# Patient Record
Sex: Female | Born: 2000 | Race: Black or African American | Hispanic: No | Marital: Single | Smoking: Former smoker
Health system: Southern US, Community
[De-identification: ages and names within clinical notes are randomized; demographics above are authoritative.]

## PROBLEM LIST (undated history)

## (undated) ENCOUNTER — Inpatient Hospital Stay (HOSPITAL_COMMUNITY): Payer: Self-pay

## (undated) ENCOUNTER — Emergency Department (HOSPITAL_COMMUNITY)

## (undated) DIAGNOSIS — Z789 Other specified health status: Secondary | ICD-10-CM

## (undated) HISTORY — PX: NO PAST SURGERIES: SHX2092

## (undated) HISTORY — DX: Other specified health status: Z78.9

---
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
## 2020-08-23 ENCOUNTER — Ambulatory Visit
Admission: EM | Admit: 2020-08-23 | Discharge: 2020-08-23 | Disposition: A | Discharge status: Home / Self Care | Payer: Medicaid Other

## 2020-08-23 ENCOUNTER — Other Ambulatory Visit: Payer: Self-pay

## 2020-08-23 ENCOUNTER — Encounter: Payer: Self-pay | Admitting: Family Medicine

## 2020-08-23 DIAGNOSIS — R519 Headache, unspecified: Secondary | ICD-10-CM

## 2020-08-23 NOTE — ED Triage Notes (Signed)
Pt missed work for headache that has resolved, just need work note to excuse

## 2020-08-23 NOTE — ED Provider Notes (Signed)
[facility]    CSN: [id number] Arrival date & time: 08/23/20  1148      History   Chief Complaint Chief Complaint  Patient presents with   Letter for School/Work    HPI Jane Duke is a 20 y.o. female.   Reports headache yesterday and today. States that the pain is at the temples and at the front of her head. States that the pain woke her up last night. States that she took Excedrin at midnight last night with relief. Denies previous symptoms. Denies dizziness, fatigue, SOB, abdominal pain, nausea, vomiting, diarrhea, rash, fever, other symptoms.  ROS per HPI  The history is provided by the patient.   History reviewed. No pertinent past medical history.  There are no problems to display for this patient.   History reviewed. No pertinent surgical history.  OB History   No obstetric history on file.      Home Medications    Prior to Admission medications   Not on File    Family History History reviewed. No pertinent family history.  Social History     Allergies   Patient has no known allergies.   Review of Systems Review of Systems   Physical Exam Triage Vital Signs ED Triage Vitals [08/23/20 1259]  Enc Vitals Group     BP 107/70     Pulse Rate 72     Resp 20     Temp 98.4 F (36.9 C)     Temp src      SpO2 97 %     Weight      Height      Head Circumference      Peak Flow      Pain Score      Pain Loc      Pain Edu?      Excl. in GC?    No data found.  Updated Vital Signs BP 107/70   Pulse 72   Temp 98.4 F (36.9 C)   Resp 20   SpO2 97%       Physical Exam Vitals and nursing note reviewed.  Constitutional:      General: She is not in acute distress.    Appearance: Normal appearance. She is well-developed.  HENT:     Head: Normocephalic and atraumatic.     Nose: Nose normal.     Mouth/Throat:     Mouth: Mucous membranes are moist.     Pharynx: Oropharynx is clear.  Eyes:     Extraocular Movements:  Extraocular movements intact.     Conjunctiva/sclera: Conjunctivae normal.     Pupils: Pupils are equal, round, and reactive to light.  Cardiovascular:     Rate and Rhythm: Normal rate and regular rhythm.  Pulmonary:     Effort: Pulmonary effort is normal. No respiratory distress.  Musculoskeletal:        General: Normal range of motion.     Cervical back: Normal range of motion and neck supple.  Skin:    General: Skin is warm and dry.     Capillary Refill: Capillary refill takes less than 2 seconds.  Neurological:     General: No focal deficit present.     Mental Status: She is alert and oriented to person, place, and time.  Psychiatric:        Mood and Affect: Mood normal.        Behavior: Behavior normal.        Thought Content: Thought content normal.  UC Treatments / Results  Labs (all labs ordered are listed, but only abnormal results are displayed) Labs Reviewed - No data to display  EKG   Radiology No results found.  Procedures Procedures (including critical care time)  Medications Ordered in UC Medications - No data to display  Initial Impression / Assessment and Plan / UC Course  I have reviewed the triage vital signs and the nursing notes.  Pertinent labs & imaging results that were available during my care of the patient were reviewed by me and considered in my medical decision making (see chart for details).    Headache  May continue Excedrin migraine Drink plenty of fluids as well Work note provided Follow up with this office or with primary care if symptoms are persisting.  Follow up in the ER for high fever, trouble swallowing, trouble breathing, other concerning symptoms.   Final Clinical Impressions(s) / UC Diagnoses   Final diagnoses:  Nonintractable headache, unspecified chronicity pattern, unspecified headache type     Discharge Instructions      May take excedrin migraine for headaches as needed  Follow up with this office  or with primary care if symptoms are persisting.  Follow up in the ER for high fever, trouble swallowing, trouble breathing, other concerning symptoms.      ED Prescriptions   None    PDMP not reviewed this encounter.   Moshe Cipro, NP 08/23/20 1350

## 2020-08-23 NOTE — Discharge Instructions (Signed)
May take excedrin migraine for headaches as needed  Follow up with this office or with primary care if symptoms are persisting.  Follow up in the ER for high fever, trouble swallowing, trouble breathing, other concerning symptoms.

## 2021-03-04 NOTE — L&D Delivery Note (Signed)
OB/GYN Faculty Practice Delivery Note  Jane Duke is a 21 y.o. G1P0 s/p SVD at [redacted]w[redacted]d. She was admitted for PROM.   ROM: 13h 66m with clear fluid GBS Status:  Negative/-- (09/25 1625)  Labor Progress: Initial SVE: 1.5/60/-2. She then progressed to complete.   Delivery Date/Time: 12/20/21 2034 Delivery: Called to room and patient was complete and pushing. Head delivered OA. No nuchal cord present. Shoulder and body delivered in usual fashion. Infant with spontaneous cry, placed on mother's abdomen, dried and stimulated. Cord clamped x 2 after 1-minute delay, and cut by FOB. Cord blood drawn. Placenta delivered spontaneously with gentle cord traction. Fundus firm with massage and Pitocin. Labia, perineum, vagina, and cervix inspected with 1st degree laceration noted, repaired in usual fashion.  Baby Weight: pending  Placenta: 3 vessel, intact. Sent to L&D Complications: None Lacerations: 1st degree EBL: 86 mL Analgesia: None   Infant:  APGAR (1 MIN): 6   APGAR (5 MINS): Barton, DO OB Family Medicine Fellow, [facility] for [employer], [employer] [facility] 12/20/2021, 9:11 PM

## 2021-04-17 ENCOUNTER — Ambulatory Visit
Admission: EM | Admit: 2021-04-17 | Discharge: 2021-04-17 | Disposition: A | Discharge status: Home / Self Care | Payer: Medicaid Other | Attending: Student | Admitting: Student

## 2021-04-17 ENCOUNTER — Other Ambulatory Visit: Payer: Self-pay

## 2021-04-17 DIAGNOSIS — Z3201 Encounter for pregnancy test, result positive: Secondary | ICD-10-CM

## 2021-04-17 LAB — POCT URINE PREGNANCY: Preg Test, Ur: POSITIVE — AB

## 2021-04-17 NOTE — ED Provider Notes (Signed)
[facility]    CSN: [id number] Arrival date & time: 04/17/21  0951      History   Chief Complaint Chief Complaint  Patient presents with   Possible Pregnancy    HPI Jane Duke is a 21 y.o. female presenting to verify pregnancy.  Medical history noncontributory, this is her first pregnancy.  Last menstrual period was 03/24/2021.  She is feeling well, denies nausea, vomiting, abdominal pain, vaginal spotting, vaginal discharge, urinary symptoms.  HPI  History reviewed. No pertinent past medical history.  There are no problems to display for this patient.   History reviewed. No pertinent surgical history.  OB History   No obstetric history on file.      Home Medications    Prior to Admission medications   Not on File    Family History History reviewed. No pertinent family history.  Social History     Allergies   Patient has no known allergies.   Review of Systems Review of Systems  Genitourinary:  Negative for menstrual problem.  All other systems reviewed and are negative.   Physical Exam Triage Vital Signs ED Triage Vitals  Enc Vitals Group     BP 04/17/21 1105 113/71     Pulse Rate 04/17/21 1105 87     Resp 04/17/21 1105 18     Temp 04/17/21 1105 98.3 F (36.8 C)     Temp src --      SpO2 04/17/21 1105 98 %     Weight --      Height --      Head Circumference --      Peak Flow --      Pain Score 04/17/21 1057 0     Pain Loc --      Pain Edu? --      Excl. in GC? --    No data found.  Updated Vital Signs BP 113/71    Pulse 87    Temp 98.3 F (36.8 C)    Resp 18    LMP 03/24/2021    SpO2 98%   Visual Acuity Right Eye Distance:   Left Eye Distance:   Bilateral Distance:    Right Eye Near:   Left Eye Near:    Bilateral Near:     Physical Exam Vitals reviewed.  Constitutional:      General: She is not in acute distress.    Appearance: Normal appearance. She is not ill-appearing.  HENT:     Head: Normocephalic  and atraumatic.     Mouth/Throat:     Mouth: Mucous membranes are moist.     Comments: Moist mucous membranes Eyes:     Extraocular Movements: Extraocular movements intact.     Pupils: Pupils are equal, round, and reactive to light.  Cardiovascular:     Rate and Rhythm: Normal rate and regular rhythm.     Heart sounds: Normal heart sounds.  Pulmonary:     Effort: Pulmonary effort is normal.     Breath sounds: Normal breath sounds. No wheezing, rhonchi or rales.  Abdominal:     General: Bowel sounds are normal. There is no distension.     Palpations: Abdomen is soft. There is no mass.     Tenderness: There is no abdominal tenderness. There is no right CVA tenderness, left CVA tenderness, guarding or rebound.  Skin:    General: Skin is warm.     Capillary Refill: Capillary refill takes less than 2 seconds.  Comments: Good skin turgor  Neurological:     General: No focal deficit present.     Mental Status: She is alert and oriented to person, place, and time.  Psychiatric:        Mood and Affect: Mood normal.        Behavior: Behavior normal.     UC Treatments / Results  Labs (all labs ordered are listed, but only abnormal results are displayed) Labs Reviewed  POCT URINE PREGNANCY - Abnormal; Notable for the following components:      Result Value   Preg Test, Ur Positive (*)    All other components within normal limits    EKG   Radiology No results found.  Procedures Procedures (including critical care time)  Medications Ordered in UC Medications - No data to display  Initial Impression / Assessment and Plan / UC Course  I have reviewed the triage vital signs and the nursing notes.  Pertinent labs & imaging results that were available during my care of the patient were reviewed by me and considered in my medical decision making (see chart for details).     This patient is a very pleasant 21 y.o. year old female presenting to confirm pregnancy. LMP 03/24/21.  Positive home pregnancy test. Positive test confirmed in clinic today. She plans to establish with [facility]. ED return precautions discussed. Patient verbalizes understanding and agreement.     Final Clinical Impressions(s) / UC Diagnoses   Final diagnoses:  Positive pregnancy test     Discharge Instructions      -You are pregnant! Congratuations!  -You can establish care with [facility] for further management    ED Prescriptions   None    PDMP not reviewed this encounter.   Rhys Martini, PA-C 04/17/21 1136

## 2021-04-17 NOTE — Discharge Instructions (Signed)
-  You are pregnant! Congratuations!  -You can establish care with [facility] for further management

## 2021-04-17 NOTE — ED Triage Notes (Signed)
Pt had positive home pregnancy test, OB GYN needs confirmation pregnancy test before they  will see her

## 2021-05-15 ENCOUNTER — Emergency Department (HOSPITAL_COMMUNITY): Payer: Medicaid Other

## 2021-05-15 ENCOUNTER — Emergency Department (HOSPITAL_COMMUNITY)
Admission: EM | Admit: 2021-05-15 | Discharge: 2021-05-15 | Disposition: A | Discharge status: Home / Self Care | Payer: Medicaid Other | Attending: Emergency Medicine | Admitting: Emergency Medicine

## 2021-05-15 ENCOUNTER — Encounter (HOSPITAL_COMMUNITY): Payer: Self-pay | Admitting: *Deleted

## 2021-05-15 DIAGNOSIS — Z3490 Encounter for supervision of normal pregnancy, unspecified, unspecified trimester: Secondary | ICD-10-CM

## 2021-05-15 DIAGNOSIS — O26891 Other specified pregnancy related conditions, first trimester: Secondary | ICD-10-CM | POA: Diagnosis present

## 2021-05-15 DIAGNOSIS — Z3A08 8 weeks gestation of pregnancy: Secondary | ICD-10-CM | POA: Insufficient documentation

## 2021-05-15 DIAGNOSIS — O209 Hemorrhage in early pregnancy, unspecified: Secondary | ICD-10-CM

## 2021-05-15 LAB — URINALYSIS, ROUTINE W REFLEX MICROSCOPIC
Bilirubin Urine: NEGATIVE
Glucose, UA: NEGATIVE mg/dL
Hgb urine dipstick: NEGATIVE
Ketones, ur: NEGATIVE mg/dL
Leukocytes,Ua: NEGATIVE
Nitrite: NEGATIVE
Protein, ur: NEGATIVE mg/dL
Specific Gravity, Urine: 1.003 — ABNORMAL LOW (ref 1.005–1.030)
pH: 7 (ref 5.0–8.0)

## 2021-05-15 LAB — CBC WITH DIFFERENTIAL/PLATELET
Abs Immature Granulocytes: 0.06 10*3/uL (ref 0.00–0.07)
Basophils Absolute: 0 10*3/uL (ref 0.0–0.1)
Basophils Relative: 0 %
Eosinophils Absolute: 0 10*3/uL (ref 0.0–0.5)
Eosinophils Relative: 0 %
HCT: 34.6 % — ABNORMAL LOW (ref 36.0–46.0)
Hemoglobin: 11.6 g/dL — ABNORMAL LOW (ref 12.0–15.0)
Immature Granulocytes: 0 %
Lymphocytes Relative: 11 %
Lymphs Abs: 1.6 10*3/uL (ref 0.7–4.0)
MCH: 31.6 pg (ref 26.0–34.0)
MCHC: 33.5 g/dL (ref 30.0–36.0)
MCV: 94.3 fL (ref 80.0–100.0)
Monocytes Absolute: 0.9 10*3/uL (ref 0.1–1.0)
Monocytes Relative: 7 %
Neutro Abs: 11.3 10*3/uL — ABNORMAL HIGH (ref 1.7–7.7)
Neutrophils Relative %: 82 %
Platelets: 231 10*3/uL (ref 150–400)
RBC: 3.67 MIL/uL — ABNORMAL LOW (ref 3.87–5.11)
RDW: 12.7 % (ref 11.5–15.5)
WBC: 13.9 10*3/uL — ABNORMAL HIGH (ref 4.0–10.5)
nRBC: 0 % (ref 0.0–0.2)

## 2021-05-15 LAB — BASIC METABOLIC PANEL
Anion gap: 8 (ref 5–15)
BUN: 10 mg/dL (ref 6–20)
CO2: 23 mmol/L (ref 22–32)
Calcium: 9 mg/dL (ref 8.9–10.3)
Chloride: 102 mmol/L (ref 98–111)
Creatinine, Ser: 0.66 mg/dL (ref 0.44–1.00)
GFR, Estimated: >60 mL/min (ref >60)
Glucose, Bld: 102 mg/dL — ABNORMAL HIGH (ref 70–99)
Potassium: 3.2 mmol/L — ABNORMAL LOW (ref 3.5–5.1)
Sodium: 133 mmol/L — ABNORMAL LOW (ref 135–145)

## 2021-05-15 LAB — HCG, QUANTITATIVE, PREGNANCY: hCG, Beta Chain, Quant, S: 314794 m[IU]/mL — ABNORMAL HIGH (ref <5)

## 2021-05-15 LAB — ABO/RH: ABO/RH(D): O POS

## 2021-05-15 NOTE — ED Triage Notes (Signed)
LMP 03-24-20. States she has been spotting for the past 2 days ?

## 2021-05-15 NOTE — Discharge Instructions (Signed)
Your ultrasound today shows that you have a single intrauterine pregnancy at 8 weeks and 5 days.  Please [facility].  You may take Tylenol if needed but avoid NSAIDs such as aspirin, ibuprofen or Aleve.  You may want to take a prenatal vitamin.  Return to the emergency department for any new or worsening symptoms. ?
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
## 2021-05-21 ENCOUNTER — Other Ambulatory Visit: Payer: Medicaid Other

## 2021-06-13 ENCOUNTER — Encounter: Payer: Self-pay | Admitting: *Deleted

## 2021-06-13 DIAGNOSIS — Z3400 Encounter for supervision of normal first pregnancy, unspecified trimester: Secondary | ICD-10-CM | POA: Insufficient documentation

## 2021-06-14 ENCOUNTER — Other Ambulatory Visit: Payer: Self-pay | Admitting: Obstetrics & Gynecology

## 2021-06-14 DIAGNOSIS — Z3682 Encounter for antenatal screening for nuchal translucency: Secondary | ICD-10-CM

## 2021-06-15 ENCOUNTER — Ambulatory Visit: Payer: Medicaid Other | Admitting: *Deleted

## 2021-06-15 ENCOUNTER — Ambulatory Visit (INDEPENDENT_AMBULATORY_CARE_PROVIDER_SITE_OTHER): Payer: Medicaid Other | Admitting: Women's Health

## 2021-06-15 ENCOUNTER — Encounter: Payer: Self-pay | Admitting: Women's Health

## 2021-06-15 ENCOUNTER — Ambulatory Visit (INDEPENDENT_AMBULATORY_CARE_PROVIDER_SITE_OTHER): Payer: Medicaid Other

## 2021-06-15 VITALS — BP 126/74 | HR 90 | Wt 136.0 lb

## 2021-06-15 DIAGNOSIS — Z3A13 13 weeks gestation of pregnancy: Secondary | ICD-10-CM | POA: Diagnosis not present

## 2021-06-15 DIAGNOSIS — Z3402 Encounter for supervision of normal first pregnancy, second trimester: Secondary | ICD-10-CM

## 2021-06-15 DIAGNOSIS — Z3682 Encounter for antenatal screening for nuchal translucency: Secondary | ICD-10-CM

## 2021-06-15 DIAGNOSIS — Z3401 Encounter for supervision of normal first pregnancy, first trimester: Secondary | ICD-10-CM

## 2021-06-15 LAB — POCT URINALYSIS DIPSTICK OB
Blood, UA: NEGATIVE
Glucose, UA: NEGATIVE
Ketones, UA: NEGATIVE
Leukocytes, UA: NEGATIVE
Nitrite, UA: NEGATIVE

## 2021-06-15 MED ORDER — BLOOD PRESSURE MONITOR MISC: 0 refills | Status: DC

## 2021-06-15 MED ORDER — ASPIRIN 81 MG PO TBEC: 81.0000 mg | DELAYED_RELEASE_TABLET | Freq: Every day | ORAL | 3 refills | Status: DC

## 2021-06-15 NOTE — Patient Instructions (Signed)
Fallyn, thank you for choosing our office today! We appreciate the opportunity to meet your healthcare needs. You may receive a short survey by mail, e-mail, or through [employer]. If you are happy with your care we would appreciate if you could take just a few minutes to complete the survey questions. We read all of your comments and take your feedback very seriously. Thank you again for choosing our office.  ?Center for [employer] Team at [facility] ? [facility] [place] [facility] ?([address] [place], [place] [address]) ?Entrance C, located off of [address] ?Free 24/7 valet parking  ? Nausea & Vomiting ?Have saltine crackers or pretzels by your bed and eat a few bites before you raise your head out of bed in the morning ?Eat small frequent meals throughout the day instead of large meals ?Drink plenty of fluids throughout the day to stay hydrated, just don't drink a lot of fluids with your meals.  This can make your stomach fill up faster making you feel sick ?Do not brush your teeth right after you eat ?Products with real ginger are good for nausea, like ginger ale and ginger hard candy Make sure it says made with real ginger! ?Sucking on sour candy like lemon heads is also good for nausea ?If your prenatal vitamins make you nauseated, take them at night so you will sleep through the nausea ?Sea Bands ?If you feel like you need medicine for the nausea & vomiting please let us know ?If you are unable to keep any fluids or food down please let us know ? ? Constipation ?Drink plenty of fluid, preferably water, throughout the day ?Eat foods high in fiber such as fruits, vegetables, and grains ?Exercise, such as walking, is a good way to keep your bowels regular ?Drink warm fluids, especially warm prune juice, or decaf coffee ?Eat a 1/2 cup of real oatmeal (not instant), 1/2 cup applesauce, and 1/2-1 cup warm prune juice every day ?If needed, you may take Colace (docusate sodium) stool softener  once or twice a day to help keep the stool soft.  ?If you still are having problems with constipation, you may take Miralax once daily as needed to help keep your bowels regular.  ? ?Home Blood Pressure Monitoring for Patients  ? ?Your provider has recommended that you check your blood pressure (BP) at least once a week at home. If you do not have a blood pressure cuff at home, one will be provided for you. Contact your provider if you have not received your monitor within 1 week.  ? ?Helpful Tips for Accurate Home Blood Pressure Checks  ?Don't smoke, exercise, or drink caffeine 30 minutes before checking your BP ?Use the restroom before checking your BP (a full bladder can raise your pressure) ?Relax in a comfortable upright chair ?Feet on the ground ?Left arm resting comfortably on a flat surface at the level of your heart ?Legs uncrossed ?Back supported ?Sit quietly and don't talk ?Place the cuff on your bare arm ?Adjust snuggly, so that only two fingertips can fit between your skin and the top of the cuff ?Check 2 readings separated by at least one minute ?Keep a log of your BP readings ?For a visual, please reference this diagram: http://ccnc.care/bpdiagram ? ?Provider Name: [facility]     Phone: [phone] ? ?Zone 1: ALL CLEAR  ?Continue to monitor your symptoms:  ?BP reading is less than 140 (top number) or less than 90 (bottom  number)  ?No right upper stomach pain ?No headaches or seeing spots ?No feeling nauseated or throwing up ?No swelling in face and hands ? ?Zone 2: CAUTION ?Call your doctor's office for any of the following:  ?BP reading is greater than 140 (top number) or greater than 90 (bottom number)  ?Stomach pain under your ribs in the middle or right side ?Headaches or seeing spots ?Feeling nauseated or throwing up ?Swelling in face and hands ? ?Zone 3: EMERGENCY  ?Seek immediate medical care if you have any of the following:  ?BP reading is greater than160 (top number) or greater than  110 (bottom number) ?Severe headaches not improving with Tylenol ?Serious difficulty catching your breath ?Any worsening symptoms from Zone 2  ? ? First Trimester of Pregnancy ?The first trimester of pregnancy is from week 1 until the end of week 12 (months 1 through 3). A week after a sperm fertilizes an egg, the egg will implant on the wall of the uterus. This embryo will begin to develop into a baby. Genes from you and your partner are forming the baby. The female genes determine whether the baby is a boy or a girl. At 6-8 weeks, the eyes and face are formed, and the heartbeat can be seen on ultrasound. At the end of 12 weeks, all the baby's organs are formed.  ?Now that you are pregnant, you will want to do everything you can to have a healthy baby. Two of the most important things are to get good prenatal care and to follow your health care provider's instructions. Prenatal care is all the medical care you receive before the baby's birth. This care will help prevent, find, and treat any problems during the pregnancy and childbirth. ?BODY CHANGES ?Your body goes through many changes during pregnancy. The changes vary from woman to woman.  ?You may gain or lose a couple of pounds at first. ?You may feel sick to your stomach (nauseous) and throw up (vomit). If the vomiting is uncontrollable, call your health care provider. ?You may tire easily. ?You may develop headaches that can be relieved by medicines approved by your health care provider. ?You may urinate more often. Painful urination may mean you have a bladder infection. ?You may develop heartburn as a result of your pregnancy. ?You may develop constipation because certain hormones are causing the muscles that push waste through your intestines to slow down. ?You may develop hemorrhoids or swollen, bulging veins (varicose veins). ?Your breasts may begin to grow larger and become tender. Your nipples may stick out more, and the tissue that surrounds them  (areola) may become darker. ?Your gums may bleed and may be sensitive to brushing and flossing. ?Dark spots or blotches (chloasma, mask of pregnancy) may develop on your face. This will likely fade after the baby is born. ?Your menstrual periods will stop. ?You may have a loss of appetite. ?You may develop cravings for certain kinds of food. ?You may have changes in your emotions from day to day, such as being excited to be pregnant or being concerned that something may go wrong with the pregnancy and baby. ?You may have more vivid and strange dreams. ?You may have changes in your hair. These can include thickening of your hair, rapid growth, and changes in texture. Some women also have hair loss during or after pregnancy, or hair that feels dry or thin. Your hair will most likely return to normal after your baby is born. ?WHAT TO EXPECT AT YOUR PRENATAL  VISITS ?During a routine prenatal visit: ?You will be weighed to make sure you and the baby are growing normally. ?Your blood pressure will be taken. ?Your abdomen will be measured to track your baby's growth. ?The fetal heartbeat will be listened to starting around week 10 or 12 of your pregnancy. ?Test results from any previous visits will be discussed. ?Your health care provider may ask you: ?How you are feeling. ?If you are feeling the baby move. ?If you have had any abnormal symptoms, such as leaking fluid, bleeding, severe headaches, or abdominal cramping. ?If you have any questions. ?Other tests that may be performed during your first trimester include: ?Blood tests to find your blood type and to check for the presence of any previous infections. They will also be used to check for low iron levels (anemia) and Rh antibodies. Later in the pregnancy, blood tests for diabetes will be done along with other tests if problems develop. ?Urine tests to check for infections, diabetes, or protein in the urine. ?An ultrasound to confirm the proper growth and development  of the baby. ?An amniocentesis to check for possible genetic problems. ?Fetal screens for spina bifida and Down syndrome. ?You may need other tests to make sure you and the baby are doing well. ?HOME CARE

## 2021-06-15 NOTE — Progress Notes (Signed)
[place] 13+1 wks,measurements c/w dates,posterior placenta gr 0,normal right ovary,left ovary not visualized,NB present,NT 1.5 mm,FHR 160 BPM,CRL 72.94 mm ?

## 2021-06-15 NOTE — Progress Notes (Signed)
? ? ?INITIAL OBSTETRICAL VISIT ?Patient name: Jane Duke MRN [id number]  Date of birth: 06-Aug-2000 ?Chief Complaint:   ?Initial Prenatal Visit ? ?History of Present Illness:   ?Jane Duke is a 21 y.o. G1P0 African-American female at [redacted]w[redacted]d by Jane Duke at 8 weeks with an Estimated Date of Delivery: 12/20/21 being seen today for her initial obstetrical visit.   ?Patient's last menstrual period was 03/24/2021. ?Her obstetrical history is significant for primigravida.   ?Today she reports no complaints.  ?Last pap <21yo. Results were: N/A ? ? ?  06/15/2021  ?  9:29 AM  ?Depression screen PHQ 2/9  ?Decreased Interest 1  ?Down, Depressed, Hopeless 1  ?PHQ - 2 Score 2  ?Altered sleeping 0  ?Tired, decreased energy 1  ?Change in appetite 0  ?Feeling bad or failure about yourself  0  ?Trouble concentrating 0  ?Moving slowly or fidgety/restless 0  ?Suicidal thoughts 0  ?PHQ-9 Score 3  ? ?  ? ?  06/15/2021  ?  9:31 AM  ?GAD 7 : Generalized Anxiety Score  ?Nervous, Anxious, on Edge 0  ?Control/stop worrying 0  ?Worry too much - different things 1  ?Trouble relaxing 1  ?Restless 1  ?Easily annoyed or irritable 1  ?Afraid - awful might happen 0  ?Total GAD 7 Score 4  ? ? ? ?Review of Systems:   ?Pertinent items are noted in HPI ?Denies cramping/contractions, leakage of fluid, vaginal bleeding, abnormal vaginal discharge w/ itching/odor/irritation, headaches, visual changes, shortness of breath, chest pain, abdominal pain, severe nausea/vomiting, or problems with urination or bowel movements unless otherwise stated above.  ?Pertinent History Reviewed:  ?Reviewed past medical,surgical, social, obstetrical and family history.  ?Reviewed problem list, medications and allergies. ?OB History  ?Gravida Para Term Preterm AB Living  ?1            ?SAB IAB Ectopic Multiple Live Births  ?           ?  ?# Outcome Date GA Lbr Len/2nd Weight Sex Delivery Anes PTL Lv  ?1 Current           ? ?Physical Assessment:  ? ?Vitals:  ? 06/15/21 0840  ?BP:  126/74  ?Pulse: 90  ?Weight: 136 lb (61.7 kg)  ?Body mass index is 21.95 kg/m?. ? ?     Physical Examination: ? General appearance - well appearing, and in no distress ? Mental status - alert, oriented to person, place, and time ? Psych:  She has a normal mood and affect ? Skin - warm and dry, normal color, no suspicious lesions noted ? Chest - effort normal, all lung fields clear to auscultation bilaterally ? Heart - normal rate and regular rhythm ? Abdomen - soft, nontender ? Extremities:  No swelling or varicosities noted ? Thin prep pap is not done  ? ?Chaperone: N/A   ? ?TODAY'S NT Jane Duke 13+1 wks,measurements c/w dates,posterior placenta gr 0,normal right ovary,left ovary not visualized,NB present,NT 1.5 mm,FHR 160 BPM,CRL 72.94 mm ? ?Results for orders placed or performed in visit on 06/15/21 (from the past 24 hour(s))  ?POC Urinalysis Dipstick OB  ? Collection Time: 06/15/21  9:38 AM  ?Result Value Ref Range  ? Color, UA    ? Clarity, UA    ? Glucose, UA Negative Negative  ? Bilirubin, UA    ? Ketones, UA neg   ? Spec Grav, UA    ? Blood, UA neg   ? pH, UA    ? POC,PROTEIN,UA Trace Negative,  Trace, Small (1+), Moderate (2+), Large (3+), 4+  ? Urobilinogen, UA    ? Nitrite, UA neg   ? Leukocytes, UA Negative Negative  ? Appearance    ? Odor    ?  ?Assessment & Plan:  ?1) Low-Risk Pregnancy G1P0 at [redacted]w[redacted]d with an Estimated Date of Delivery: 12/20/21  ? ?2) Initial OB visit ? ?Meds:  ?Meds ordered this encounter  ?Medications  ? Blood Pressure Monitor MISC  ?  Sig: For regular home bp monitoring during pregnancy  ?  Dispense:  1 each  ?  Refill:  0  ?  Z34.81 ?Please mail to patient  ? aspirin 81 MG EC tablet  ?  Sig: Take 1 tablet (81 mg total) by mouth daily. Swallow whole.  ?  Dispense:  90 tablet  ?  Refill:  3  ?  Order Specific Question:   Supervising Provider  ?  Answer:   Duane Lope H [[id number]]  ? ? ?Initial labs obtained ?Continue prenatal vitamins ?Reviewed n/v relief measures and warning s/s to  report ?Reviewed recommended weight gain based on pre-gravid BMI ?Encouraged well-balanced diet ?Genetic & carrier screening discussed: requests Panorama, NT/IT, and Horizon  ?Ultrasound discussed; fetal survey: requested ?CCNC completed> form faxed if has or is planning to apply for medicaid ?The nature of [place] [facility] with multiple MDs and other Advanced Practice Providers was explained to patient; also emphasized that fellows, residents, and students are part of our team. ?Does not have home bp cuff. Office bp cuff given: no. Rx sent: yes. Check bp weekly, let Jane Duke know if consistently >140/90.  ? ?Indications for ASA therapy (per uptodate) ?OR Two or more of the following: ?Nulliparity Yes ?Sociodemographic characteristics (African American race, low socioeconomic level) Yes ? ?Follow-up: Return in about 3 weeks (around 07/06/2021) for LROB, 2nd IT, CNM, in person; then 6wks from now for [facility] w/ anatomy u/s.  ? ?Orders Placed This Encounter  ?Procedures  ? GC/Chlamydia Probe Amp  ? Urine Culture  ? Integrated 1  ? CBC/D/Plt+RPR+Rh+ABO+RubIgG...  ? Genetic Screening  ? POC Urinalysis Dipstick OB  ? ? ?Cheral Marker CNM, WHNP-BC ?06/15/2021 ?9:56 AM  ?

## 2021-06-17 LAB — URINE CULTURE

## 2021-06-18 LAB — CBC/D/PLT+RPR+RH+ABO+RUBIGG...
Antibody Screen: NEGATIVE
Basophils Absolute: 0 10*3/uL (ref 0.0–0.2)
Basos: 0 %
EOS (ABSOLUTE): 0.1 10*3/uL (ref 0.0–0.4)
Eos: 1 %
HCV Ab: NONREACTIVE
HIV Screen 4th Generation wRfx: NONREACTIVE
Hematocrit: 35.3 % (ref 34.0–46.6)
Hemoglobin: 12 g/dL (ref 11.1–15.9)
Hepatitis B Surface Ag: NEGATIVE
Immature Grans (Abs): 0.1 10*3/uL (ref 0.0–0.1)
Immature Granulocytes: 1 %
Lymphocytes Absolute: 1.4 10*3/uL (ref 0.7–3.1)
Lymphs: 11 %
MCH: 31.9 pg (ref 26.6–33.0)
MCHC: 34 g/dL (ref 31.5–35.7)
MCV: 94 fL (ref 79–97)
Monocytes Absolute: 0.8 10*3/uL (ref 0.1–0.9)
Monocytes: 6 %
Neutrophils Absolute: 10.5 10*3/uL — ABNORMAL HIGH (ref 1.4–7.0)
Neutrophils: 81 %
Platelets: 196 10*3/uL (ref 150–450)
RBC: 3.76 x10E6/uL — ABNORMAL LOW (ref 3.77–5.28)
RDW: 12.5 % (ref 11.7–15.4)
RPR Ser Ql: NONREACTIVE
Rh Factor: POSITIVE
Rubella Antibodies, IGG: 7.11 index (ref >0.99)
WBC: 12.8 10*3/uL — ABNORMAL HIGH (ref 3.4–10.8)

## 2021-06-18 LAB — INTEGRATED 1
Crown Rump Length: 72.9 mm
Gest. Age on Collection Date: 13.1 weeks
Maternal Age at EDD: 21.1 yr
Nuchal Translucency (NT): 1.5 mm
Number of Fetuses: 1
PAPP-A Value: 2796.6 ng/mL
Weight: 136 [lb_av]

## 2021-06-18 LAB — GC/CHLAMYDIA PROBE AMP
Chlamydia trachomatis, NAA: NEGATIVE
Neisseria Gonorrhoeae by PCR: NEGATIVE

## 2021-06-18 LAB — HCV INTERPRETATION

## 2021-07-06 ENCOUNTER — Ambulatory Visit (INDEPENDENT_AMBULATORY_CARE_PROVIDER_SITE_OTHER): Payer: Medicaid Other | Admitting: Obstetrics & Gynecology

## 2021-07-06 ENCOUNTER — Encounter: Payer: Self-pay | Admitting: Obstetrics & Gynecology

## 2021-07-06 VITALS — BP 118/72 | HR 97 | Wt 142.8 lb

## 2021-07-06 DIAGNOSIS — Z3402 Encounter for supervision of normal first pregnancy, second trimester: Secondary | ICD-10-CM

## 2021-07-06 DIAGNOSIS — Z1379 Encounter for other screening for genetic and chromosomal anomalies: Secondary | ICD-10-CM

## 2021-07-06 NOTE — Progress Notes (Signed)
? ?  LOW-RISK PREGNANCY VISIT ?Patient name: Jane Duke MRN [id number]  Date of birth: 03-Jan-2001 ?Chief Complaint:   ?Routine Prenatal Visit ? ?History of Present Illness:   ?Jane Duke is a 21 y.o. G1P0 female at [redacted]w[redacted]d with an Estimated Date of Delivery: 12/20/21 being seen today for ongoing management of a low-risk pregnancy.  ? ?  06/15/2021  ?  9:29 AM  ?Depression screen PHQ 2/9  ?Decreased Interest 1  ?Down, Depressed, Hopeless 1  ?PHQ - 2 Score 2  ?Altered sleeping 0  ?Tired, decreased energy 1  ?Change in appetite 0  ?Feeling bad or failure about yourself  0  ?Trouble concentrating 0  ?Moving slowly or fidgety/restless 0  ?Suicidal thoughts 0  ?PHQ-9 Score 3  ? ? ?Today she reports no complaints. Contractions: Not present. Vag. Bleeding: None.  Movement: Absent- early gestation ?denies leaking of fluid. ?Review of Systems:   ?Pertinent items are noted in HPI ?Denies abnormal vaginal discharge w/ itching/odor/irritation, headaches, visual changes, shortness of breath, chest pain, abdominal pain, severe nausea/vomiting, or problems with urination or bowel movements unless otherwise stated above. ?Pertinent History Reviewed:  ?Reviewed past medical,surgical, social, obstetrical and family history.  ?Reviewed problem list, medications and allergies. ? ?Physical Assessment:  ? ?Vitals:  ? 07/06/21 1115  ?BP: 118/72  ?Pulse: 97  ?Weight: 142 lb 12.8 oz (64.8 kg)  ?Body mass index is 23.05 kg/m?. ?  ?     Physical Examination:  ? General appearance: Well appearing, and in no distress ? Mental status: Alert, oriented to person, place, and time ? Skin: Warm & dry ? Respiratory: Normal respiratory effort, no distress ? Abdomen: Soft, gravid, nontender ? Pelvic: Cervical exam deferred        ? Extremities: Edema: None ? Psych:  mood and affect appropriate ? ?Fetal Status: Fetal Heart Rate (bpm): 155   Movement: Absent   ? ?Chaperone: n/a   ? ?No results found for this or any previous visit (from the past 24  hour(s)).  ? ?Assessment & Plan:  ?1) Low-risk pregnancy G1P0 at [redacted]w[redacted]d with an Estimated Date of Delivery: 12/20/21 ? ?-anatomy scan next visit  ? ?  ?Meds: No orders of the defined types were placed in this encounter. ? ?Labs/procedures today: IT-2 ? ?Plan:  Continue routine obstetrical care  ?Next visit: prefers in person   ? ?Reviewed: Preterm labor symptoms and general obstetric precautions including but not limited to vaginal bleeding, contractions, leaking of fluid and fetal movement were reviewed in detail with the patient.  All questions were answered.  ? ?Follow-up: Return in about 4 weeks (around 08/03/2021) for LROB visit and anatomy scan (if not already scheduled). ? ?Orders Placed This Encounter  ?Procedures  ? INTEGRATED 2  ? ? ?Myna Hidalgo, DO ?Attending Obstetrician & Gynecologist, Faculty Practice ?[facility] [employer], [facility] ? ? ? ?

## 2021-07-09 LAB — INTEGRATED 2
AFP MoM: 0.97
Alpha-Fetoprotein: 37.5 ng/mL
Crown Rump Length: 72.9 mm
DIA MoM: 1.26
DIA Value: 206.8 pg/mL
Estriol, Unconjugated: 0.79 ng/mL
Gest. Age on Collection Date: 13.1 weeks
Gestational Age: 16.1 weeks
Maternal Age at EDD: 21.1 yr
Nuchal Translucency (NT): 1.5 mm
Nuchal Translucency MoM: 0.9
Number of Fetuses: 1
PAPP-A MoM: 1.95
PAPP-A Value: 2796.6 ng/mL
Test Results:: NEGATIVE
Weight: 136 [lb_av]
Weight: 136 [lb_av]
hCG MoM: 1.03
hCG Value: 39.4 IU/mL
uE3 MoM: 0.81

## 2021-07-25 ENCOUNTER — Other Ambulatory Visit: Payer: Self-pay | Admitting: Obstetrics & Gynecology

## 2021-07-25 DIAGNOSIS — Z363 Encounter for antenatal screening for malformations: Secondary | ICD-10-CM

## 2021-07-27 ENCOUNTER — Ambulatory Visit (INDEPENDENT_AMBULATORY_CARE_PROVIDER_SITE_OTHER): Payer: Medicaid Other

## 2021-07-27 ENCOUNTER — Ambulatory Visit (INDEPENDENT_AMBULATORY_CARE_PROVIDER_SITE_OTHER): Payer: Medicaid Other | Admitting: Advanced Practice Midwife

## 2021-07-27 VITALS — BP 128/74 | HR 81 | Wt 156.0 lb

## 2021-07-27 DIAGNOSIS — Z3A19 19 weeks gestation of pregnancy: Secondary | ICD-10-CM

## 2021-07-27 DIAGNOSIS — Z363 Encounter for antenatal screening for malformations: Secondary | ICD-10-CM | POA: Diagnosis not present

## 2021-07-27 DIAGNOSIS — Z3402 Encounter for supervision of normal first pregnancy, second trimester: Secondary | ICD-10-CM

## 2021-07-27 NOTE — Progress Notes (Signed)
[place] 19+1 wks,breech,posterior placenta gr 0,CX 3.9 cm,FHR 146 bpm,normal ovaries,SVP of fluid 4.8 cm,EFW 287 g 56%,anatomy complete,no obvious abnormalities

## 2021-07-27 NOTE — Patient Instructions (Signed)
Jane Duke, thank you for choosing our office today! We appreciate the opportunity to meet your healthcare needs. You may receive a short survey by mail, e-mail, or through [employer]. If you are happy with your care we would appreciate if you could take just a few minutes to complete the survey questions. We read all of your comments and take your feedback very seriously. Thank you again for choosing our office.  [facility] [employer] Team at [facility] at [facility] ([address] [place], [place] [address]) Entrance C, located off of [address] Free 24/7 valet parking  Go to [email or web address] to register for FREE online childbirth classes  Call the office [phone]) or go to [facility] if: You begin to severe cramping Your water breaks.  Sometimes it is a big gush of fluid, sometimes it is just a trickle that keeps getting your panties wet or running down your legs You have vaginal bleeding.  It is normal to have a small amount of spotting if your cervix was checked.   [facility] Pediatricians/Family Doctors [facility]): [address] Colette Ribas, [phone]           [facility]: [address], [phone]                [facility]): [address], [phone] (call to ask if accepting patients) [facility] Department: [address], [place], [phone]    [facility] Pediatricians/Family Doctors [facility]): [phone] [address], [phone] [facility]: [address] [place], [phone] [facility]: [address], [phone]  [facility] Doctors  [facility]): [phone] [facility]: [address] [phone]   [facility] Doctors [facility]: [address], [phone]  [facility] Doctors  [facility]: [phone], [phone]  Home Blood Pressure Monitoring for Patients   Your provider has recommended that you check your blood pressure (BP) at least once a week at home. If you do not have a blood pressure cuff at home, one will be provided for you. Contact your provider if you have not received your monitor within 1 week.   Helpful Tips for Accurate Home Blood Pressure Checks  Don't smoke, exercise, or drink caffeine 30 minutes before checking your BP Use the restroom before checking your BP (a full bladder can raise your pressure) Relax in a comfortable upright chair Feet on the ground Left arm resting comfortably on a flat surface at the level of your heart Legs uncrossed Back supported Sit quietly and don't talk Place the cuff on your bare arm Adjust snuggly, so that only two fingertips can fit between your skin and the top of the cuff Check 2 readings separated by at least one minute Keep a log of your BP readings For a visual, please reference this diagram: http://ccnc.care/bpdiagram  Provider Name: [facility]     Phone: [phone]  Zone 1: ALL CLEAR  Continue to monitor your symptoms:  BP reading is less than 140 (top number) or less than 90 (bottom number)  No right upper stomach pain No headaches or seeing spots No feeling nauseated or throwing up No swelling in face and hands  Zone 2: CAUTION Call your doctor's office for any of the following:  BP reading is greater than 140 (top number) or greater than  90 (bottom number)  Stomach pain under your ribs in the middle or right side Headaches or seeing spots Feeling nauseated or throwing up Swelling in face and hands  Zone 3: EMERGENCY  Seek immediate medical care if you have any of the following:  BP reading is greater than160 (top number) or greater than 110 (bottom number) Severe headaches not improving with Tylenol Serious difficulty catching your breath Any worsening symptoms from  Zone 2     Second Trimester of Pregnancy The second trimester is from week 14 through week 27 (months 4 through 6). The second trimester is often a time when you feel your best. Your body has adjusted to being pregnant, and you begin to feel better physically. Usually, morning sickness has lessened or quit completely, you may have more energy, and you may have an increase in appetite. The second trimester is also a time when the fetus is growing rapidly. At the end of the sixth month, the fetus is about 9 inches long and weighs about 1 pounds. You will likely begin to feel the baby move (quickening) between 16 and 20 weeks of pregnancy. Body changes during your second trimester Your body continues to go through many changes during your second trimester. The changes vary from woman to woman. Your weight will continue to increase. You will notice your lower abdomen bulging out. You may begin to get stretch marks on your hips, abdomen, and breasts. You may develop headaches that can be relieved by medicines. The medicines should be approved by your health care provider. You may urinate more often because the fetus is pressing on your bladder. You may develop or continue to have heartburn as a result of your pregnancy. You may develop constipation because certain hormones are causing the muscles that push waste through your intestines to slow down. You may develop hemorrhoids or swollen, bulging veins (varicose veins). You may have back pain. This is caused by: Weight gain. Pregnancy hormones that are relaxing the joints in your pelvis. A shift in weight and the muscles that support your balance. Your breasts will continue to grow and they will continue to become tender. Your gums may bleed and may be sensitive to brushing and flossing. Dark spots or blotches (chloasma, mask of pregnancy) may develop on your face. This will likely fade after the baby is born. A dark line from your belly button to  the pubic area (linea nigra) may appear. This will likely fade after the baby is born. You may have changes in your hair. These can include thickening of your hair, rapid growth, and changes in texture. Some women also have hair loss during or after pregnancy, or hair that feels dry or thin. Your hair will most likely return to normal after your baby is born.  What to expect at prenatal visits During a routine prenatal visit: You will be weighed to make sure you and the fetus are growing normally. Your blood pressure will be taken. Your abdomen will be measured to track your baby's growth. The fetal heartbeat will be listened to. Any test results from the previous visit will be discussed.  Your health care provider may ask you: How you are feeling. If you are feeling the baby move. If you have had any abnormal symptoms, such as leaking fluid, bleeding, severe headaches, or abdominal cramping. If you are using any tobacco products, including cigarettes, chewing tobacco, and electronic cigarettes. If you have any questions.  Other tests that may be performed during   your second trimester include: Blood tests that check for: Low iron levels (anemia). High blood sugar that affects pregnant women (gestational diabetes) between 24 and 28 weeks. Rh antibodies. This is to check for a protein on red blood cells (Rh factor). Urine tests to check for infections, diabetes, or protein in the urine. An ultrasound to confirm the proper growth and development of the baby. An amniocentesis to check for possible genetic problems. Fetal screens for spina bifida and Down syndrome. HIV (human immunodeficiency virus) testing. Routine prenatal testing includes screening for HIV, unless you choose not to have this test.  Follow these instructions at home: Medicines Follow your health care provider's instructions regarding medicine use. Specific medicines may be either safe or unsafe to take during  pregnancy. Take a prenatal vitamin that contains at least 600 micrograms (mcg) of folic acid. If you develop constipation, try taking a stool softener if your health care provider approves. Eating and drinking Eat a balanced diet that includes fresh fruits and vegetables, whole grains, good sources of protein such as meat, eggs, or tofu, and low-fat dairy. Your health care provider will help you determine the amount of weight gain that is right for you. Avoid raw meat and uncooked cheese. These carry germs that can cause birth defects in the baby. If you have low calcium intake from food, talk to your health care provider about whether you should take a daily calcium supplement. Limit foods that are high in fat and processed sugars, such as fried and sweet foods. To prevent constipation: Drink enough fluid to keep your urine clear or pale yellow. Eat foods that are high in fiber, such as fresh fruits and vegetables, whole grains, and beans. Activity Exercise only as directed by your health care provider. Most women can continue their usual exercise routine during pregnancy. Try to exercise for 30 minutes at least 5 days a week. Stop exercising if you experience uterine contractions. Avoid heavy lifting, wear low heel shoes, and practice good posture. A sexual relationship may be continued unless your health care provider directs you otherwise. Relieving pain and discomfort Wear a good support bra to prevent discomfort from breast tenderness. Take warm sitz baths to soothe any pain or discomfort caused by hemorrhoids. Use hemorrhoid cream if your health care provider approves. Rest with your legs elevated if you have leg cramps or low back pain. If you develop varicose veins, wear support hose. Elevate your feet for 15 minutes, 3-4 times a day. Limit salt in your diet. Prenatal Care Write down your questions. Take them to your prenatal visits. Keep all your prenatal visits as told by your health  care provider. This is important. Safety Wear your seat belt at all times when driving. Make a list of emergency phone numbers, including numbers for family, friends, the hospital, and police and fire departments. General instructions Ask your health care provider for a referral to a local prenatal education class. Begin classes no later than the beginning of month 6 of your pregnancy. Ask for help if you have counseling or nutritional needs during pregnancy. Your health care provider can offer advice or refer you to specialists for help with various needs. Do not use hot tubs, steam rooms, or saunas. Do not douche or use tampons or scented sanitary pads. Do not cross your legs for long periods of time. Avoid cat litter boxes and soil used by cats. These carry germs that can cause birth defects in the baby and possibly loss of the   fetus by miscarriage or stillbirth. Avoid all smoking, herbs, alcohol, and unprescribed drugs. Chemicals in these products can affect the formation and growth of the baby. Do not use any products that contain nicotine or tobacco, such as cigarettes and e-cigarettes. If you need help quitting, ask your health care provider. Visit your dentist if you have not gone yet during your pregnancy. Use a soft toothbrush to brush your teeth and be gentle when you floss. Contact a health care provider if: You have dizziness. You have mild pelvic cramps, pelvic pressure, or nagging pain in the abdominal area. You have persistent nausea, vomiting, or diarrhea. You have a bad smelling vaginal discharge. You have pain when you urinate. Get help right away if: You have a fever. You are leaking fluid from your vagina. You have spotting or bleeding from your vagina. You have severe abdominal cramping or pain. You have rapid weight gain or weight loss. You have shortness of breath with chest pain. You notice sudden or extreme swelling of your face, hands, ankles, feet, or legs. You  have not felt your baby move in over an hour. You have severe headaches that do not go away when you take medicine. You have vision changes. Summary The second trimester is from week 14 through week 27 (months 4 through 6). It is also a time when the fetus is growing rapidly. Your body goes through many changes during pregnancy. The changes vary from woman to woman. Avoid all smoking, herbs, alcohol, and unprescribed drugs. These chemicals affect the formation and growth your baby. Do not use any tobacco products, such as cigarettes, chewing tobacco, and e-cigarettes. If you need help quitting, ask your health care provider. Contact your health care provider if you have any questions. Keep all prenatal visits as told by your health care provider. This is important. This information is not intended to replace advice given to you by your health care provider. Make sure you discuss any questions you have with your health care provider. Document Released: 02/12/2001 Document Revised: 07/27/2015 Document Reviewed: 04/21/2012 Elsevier Interactive Patient Education  2017 [employer]  

## 2021-07-27 NOTE — Progress Notes (Signed)
   LOW-RISK PREGNANCY VISIT Patient name: Jane Duke MRN [id number]  Date of birth: Jan 24, 2001 Chief Complaint:   Routine Prenatal Visit  History of Present Illness:   Jane Duke is a 21 y.o. G1P0 female at [redacted]w[redacted]d with an Estimated Date of Delivery: 12/20/21 being seen today for ongoing management of a low-risk pregnancy.  Today she reports no complaints. Contractions: Not present. Vag. Bleeding: None.  Movement: Present. denies leaking of fluid. Review of Systems:   Pertinent items are noted in HPI Denies abnormal vaginal discharge w/ itching/odor/irritation, headaches, visual changes, shortness of breath, chest pain, abdominal pain, severe nausea/vomiting, or problems with urination or bowel movements unless otherwise stated above. Pertinent History Reviewed:  Reviewed past medical,surgical, social, obstetrical and family history.  Reviewed problem list, medications and allergies. Physical Assessment:   Vitals:   07/27/21 0922  BP: 128/74  Pulse: 81  Weight: 156 lb (70.8 kg)  Body mass index is 25.18 kg/m.        Physical Examination:   General appearance: Well appearing, and in no distress  Mental status: Alert, oriented to person, place, and time  Skin: Warm & dry  Cardiovascular: Normal heart rate noted  Respiratory: Normal respiratory effort, no distress  Abdomen: Soft, gravid, nontender  Pelvic: Cervical exam deferred         Extremities: Edema: None  Fetal Status: Fetal Heart Rate (bpm): 146 u/s   Movement: Present    Anatomy u/s: [place] 19+1 wks,breech,posterior placenta gr 0,CX 3.9 cm,FHR 146 bpm,normal ovaries,SVP of fluid 4.8 cm,EFW 287 g 56%,anatomy complete,no obvious abnormalities  No results found for this or any previous visit (from the past 24 hour(s)).  Assessment & Plan:  1) Low-risk pregnancy G1P0 at [redacted]w[redacted]d with an Estimated Date of Delivery: 12/20/21     Meds: No orders of the defined types were placed in this encounter.  Labs/procedures today:  anatomy u/s  Plan:  Continue routine obstetrical care   Reviewed: Preterm labor symptoms and general obstetric precautions including but not limited to vaginal bleeding, contractions, leaking of fluid and fetal movement were reviewed in detail with the patient.  All questions were answered. Didn't ask about home bp cuff.  Check bp weekly, let us know if >140/90.   Follow-up: Return in about 4 weeks (around 08/24/2021) for LROB, in person.  No orders of the defined types were placed in this encounter.  Arabella Merles CNM 07/27/2021 9:48 AM

## 2021-08-22 ENCOUNTER — Ambulatory Visit (INDEPENDENT_AMBULATORY_CARE_PROVIDER_SITE_OTHER): Payer: Medicaid Other | Admitting: Medical

## 2021-08-22 ENCOUNTER — Encounter: Payer: Self-pay | Admitting: Medical

## 2021-08-22 VITALS — BP 127/72 | HR 90 | Wt 163.0 lb

## 2021-08-22 DIAGNOSIS — Z3A22 22 weeks gestation of pregnancy: Secondary | ICD-10-CM

## 2021-08-22 DIAGNOSIS — Z3402 Encounter for supervision of normal first pregnancy, second trimester: Secondary | ICD-10-CM

## 2021-08-22 NOTE — Progress Notes (Signed)
   PRENATAL VISIT NOTE  Subjective:  Jane Duke is a 21 y.o. G1P0 at [redacted]w[redacted]d being seen today for ongoing prenatal care.  She is currently monitored for the following issues for this low-risk pregnancy and has Supervision of normal first pregnancy on their problem list.  Patient reports no complaints.  Contractions: Not present. Vag. Bleeding: None.  Movement: Present. Denies leaking of fluid.   The following portions of the patient's history were reviewed and updated as appropriate: allergies, current medications, past family history, past medical history, past social history, past surgical history and problem list.   Objective:   Vitals:   08/22/21 1156  BP: 127/72  Pulse: 90  Weight: 163 lb (73.9 kg)    Fetal Status: Fetal Heart Rate (bpm): 145 Fundal Height: 24 cm Movement: Present     General:  Alert, oriented and cooperative. Patient is in no acute distress.  Skin: Skin is warm and dry. No rash noted.   Cardiovascular: Normal heart rate noted  Respiratory: Normal respiratory effort, no problems with respiration noted  Abdomen: Soft, gravid, appropriate for gestational age.  Pain/Pressure: Absent     Pelvic: Cervical exam deferred        Extremities: Normal range of motion.  Edema: None  Mental Status: Normal mood and affect. Normal behavior. Normal judgment and thought content.   Assessment and Plan:  Pregnancy: G1P0 at [redacted]w[redacted]d 1. Encounter for supervision of normal first pregnancy in second trimester - Considering POPs vs IUD for [facility] - Peds info given and importance of choosing peds discussed  - Anticipatory guidance for fasting labs and TDAP at next visit discussed   2. [redacted] weeks gestation of pregnancy  Preterm labor symptoms and general obstetric precautions including but not limited to vaginal bleeding, contractions, leaking of fluid and fetal movement were reviewed in detail with the patient. Please refer to After Visit Summary for other counseling recommendations.    Return in about 4 weeks (around 09/19/2021) for LOB, In-Person, any provider, 28 week labs (fasting).  No future appointments.  Vonzella Nipple, PA-C

## 2021-08-22 NOTE — Patient Instructions (Addendum)
AREA PEDIATRIC/FAMILY PRACTICE PHYSICIANS  Central/Southeast [place] ([address]) [facility] Chambliss, MD; Eniola, MD; Hale, MD; Hensel, MD; McDiarmid, MD; McIntyer, MD; Neal, MD; Walden, MD [address] [place], [place] [address] [phone] Mon-Fri 8:30-12:30, 1:30-5:00 Providers come to see babies at [facility] Accepting Medicaid [facility] providers who accept newborns: Koirala, MD; Morrow, MD; Wolters, MD [address], [place], [place] [address] [phone] Mon-Fri 8:00-5:30 Babies seen by providers at [facility] Does NOT accept Medicaid Please call early in hospitalization for appointment (limited availability)  [facility] Mulberry, MD [address] [place], [place] [address] [phone] Mon, Tue, Thur, Fri 8:30-5:00, Wed 10:00-7:00 (closed 1-2pm) Babies seen by [facility] providers Accepting Medicaid Rubin - Pediatrician Rubin, MD [address], [place], [place] [address] [phone] Mon-Fri 8:30-5:00, Sat 8:30-12:00 Provider comes to see babies at [facility] Accepting Medicaid Must have been referred from current patients or contacted office prior to delivery [facility] ([facility]) Brown, MD; Chandler, MD; Ettefagh, MD; Grant, MD; Lester, MD; McCormick, MD; McQueen, MD; Prose, MD; Simha, MD; Stanley, MD; Stryffeler, NP; Tebben, NP [address], [place], [place] [address] [phone] Mon, Tue, Thur, Fri 8:30-5:30, Wed 9:30-5:30, Sat 8:30-12:30 Babies seen by [facility] providers Accepting Medicaid Only accepting infants of first-time parents or siblings of current patients Hospital discharge coordinator will make follow-up appointment Jack Amos [address], [place], [place]  [address] [phone]   Fax - [phone] [facility] [address], [place], [place]  [address] Phone - [phone]   Fax - [phone] Shilpa Gosrani [address], [place], [place]  [address] [phone]  East/Northeast [place] ([address]) [facility] Bates, MD; Brassfield, MD; Cooper, Cox, MD; MD; Davis, MD; Dovico, MD; Ettefaugh, MD; Little, MD; Lowe, MD; Keiffer, MD; Melvin, MD; Sumner, MD; Williams, MD [address], [place], [place] [address] [phone] Mon-Fri 8:30-5:00 (extended evenings Mon-Thur as needed), Sat-Sun 10:00-1:00 Providers come to see babies at [facility] Accepting Medicaid for families of first-time babies and families with all children in the household age 3 and under. Must register with office prior to making appointment (M-F only). [facility] Henson, NP; Knapp, MD; Lalonde, MD; Tysinger, PA [address] [place], [place] [address] [phone] Mon-Fri 8:00-5:00 Babies seen by providers at [facility] Does NOT accept Medicaid/Commercial Insurance Only [facility] ([facility])  Artis, MD; Barnes, MD; Bratton, MD; Coccaro, MD; [place] Gardner, MD; Kramer, MD; Marshall, MD; Netherton, MD; Poleto, MD; Skinner, MD [address] [place], [place] [address] [phone] Mon-Fri 8:30-5:30, Sat (Oct.-Mar.) 9:00-1:00 Babies seen by providers at [facility] Accepting Medicaid  [place] ([address]) [facility] Reid, MD; Warner, MD [address], [place], [place] [address] [phone] Mon-Fri 8:30-5:00, Sat 8:30-12:00 Providers come to see babies at [facility] Does NOT accept Medicaid [facility] Becker, PA; Hagler, MD; Scifres, PA; Sun, MD; Swayne, MD [address], [place], [place] [address] [phone] Mon-Fri 8:00-5:00 Babies seen by providers at [facility] Does NOT accept Medicaid Only accepting babies of parents who  are patients Please call early in hospitalization for appointment (limited availability) [facility] Clark, MD; Frye, MD; Kelleher, MD; Mack, NP; Miller, MD; O'Keller, MD; Patterson, NP; Pudlo, MD; Puzio, MD; Thomas, MD; Tucker, MD; Twiselton, MD [address], [place], [place] [address] [phone] Mon-Fri 8:00-5:00, Sat 9:00-12:00 Providers come to see babies at [facility] Does NOT accept Medicaid  [place] ([address]) [facility] providers accepting new patients: Brake, NP; Wharton, PA [address], [place], [place] [address] [phone] Mon-Fri 8:00-5:00 Babies seen by providers at [facility] Does NOT accept Medicaid Only accepting babies of parents who are patients Please call early in hospitalization for appointment (limited availability) [facility] Gay, MD; Quinlan, MD [address] [place], [place] [address] [phone] (press 1 to schedule appointment) Mon-Fri 8:00-5:00 Providers come to see babies at [facility] Does NOT accept Medicaid [facility] Mazer, MD [address] [place], [place] [address] [phone] Mon-Fri 8:30-5:00 (lunch 12:30-1:00), extended hours by appointment only Wed 5:00-6:30 Babies seen by [facility] providers Accepting Medicaid [facility] Banks, MD; Jordan, MD; Koberlein, MD [address], [place], [place] [address] [phone] Mon-Fri 8:00-5:00 Babies seen by [facility] providers Does NOT accept Medicaid [facility] Parker, MD; Hunter, MD; Wallace, DO [address] [place], [place] [address] [phone] Mon-Fri 8:00-5:00 Babies seen by [facility] providers Does NOT accept Medicaid [facility] Brandon, PA; Brecken, PA; Christy, NP; Dees, MD; DeClaire, MD; DeWeese, MD; Hansen, NP; Mills, NP; Parrish, NP; Smoot, NP; Summer, MD; Vapne,  MD [address] [place], [place] [address] [phone] Mon-Fri 8:30-5:00, Sat 10:00-1:00 Providers come to see babies at [facility] Does NOT accept Medicaid Free prenatal information session Tuesdays at 4:45pm [facility] Bouska, MD; Gordon, PA; Jeffery, PA; Weber, PA [address] [place] [address] [phone] Mon-Fri 7:30-5:30 Babies seen by [facility] providers [facility] [address], [place], [place]  [address] [phone]   Fax - [phone]  [place] ([address] & [address]) [facility] [place] [address] [place], [place] [address] [phone] Mon-Thur 8:00-6:00 Providers come to see babies at [facility] Accepting Medicaid [facility] Anderson, NP; Badger, MD; Beal, PA; Spencer, PA [address] [place], [place] [address] [phone] Mon-Thur 7:30-7:30, Fri 7:30-4:30 Babies seen by [facility] providers Accepting Medicaid [facility] Agbuya, MD; Klett, NP; Romgoolam, MD [address], [place], [place] [address] [phone] Mon-Fri 8:30-5:00, Sat 8:30-12:00 Providers come to see babies at [facility] Accepting Medicaid Must have "Meet & Greet" appointment at office prior to delivery [facility] ([facility]) McCord, MD; Wallace, MD; Wood, MD [address], [place], [place] [address] [phone] Mon-Wed 8:00-6:00, Thur-Fri 8:00-5:00, Sat 9:00-12:00 Providers come to see babies at [facility] Does NOT accept Medicaid Only accepting siblings of current patients [facility]  [address], [place], [place]  [address] [phone]   Fax - [phone] [facility] [address], [place], [place]  [address] [phone]   Fax -  [phone]  [place] ([address] & [address]) [facility] Cirigliano, DO; Matthews, DO [address] [place], [place] [address] [phone] Mon-Fri 7:00-5:00 Babies seen by [facility] providers Does NOT accept Medicaid [facility] Briscoe, MD; Howley, PA; Moreira, PA [address], [place],  [address] [phone] Mon-Fri 8:00-5:00 Babies seen by [facility] providers Accepting Medicaid [facility] Boyd, MD; Church, PA; Jones, NP; Osborn, PA [address], ,  [address] [phone] Mon-Fri 8:00-5:00 Babies seen by providers at [facility] Accepting Medicaid  [place] ([address]) [facility] [place] Wendling, DO [address] [place], [place] [address] [phone] Mon-Fri 8:00-5:00 Babies seen by [facility] providers Does NOT accept Medicaid Limited availability, please call early in hospitalization to schedule follow-up [facility] Calderon, PA; Cummings, MD; Dillard, MD; Martin, PA; Olson, MD; VanDeven, PA [address], [place], [place] [address] [phone] Mon-Fri 8:30-5:00, Sat 9:00-12:00 Babies seen by providers at [facility] Accepting Medicaid Please register online then schedule online or call office [email or web address]) Hunter, NP; Kumar, MD; Martin Rogers, PA [address], [place], [place] [address] [phone] Mon-Fri 8:00-5:00 Babies seen by providers at [facility] Accepting Medicaid [facility] ([facility]) Glasgow, MD; Kristi Fleenor, NP; West, MD [address], [place], [place] [address] [phone] Mon-Fri 8:00-5:30, Sat&Sun by appointment (phones open at  8:30) Babies seen by [facility] providers Accepting Medicaid Must be a first-time baby or sibling of current patient [facility] [place]  [address], [place], [place]  [address] [phone]   Fax - [phone]  [place] ([address]) [facility] Brown, PA; Cowen, PA; Rice, MD; Helton, PA; Spry, MD [address] [place], [place] [address] [phone] Mon-Thur 8:00-7:00, Fri 8:00-5:00, Sat 8:00-12:00, Sun 9:00-12:00 Babies seen by [facility] providers Accepting Medicaid [facility] [place] Coe-Goins, MD; Marshall, MD; Pierre-Louis, MD [address] [place], [place], [place] [address] [phone] Mon-Thur 8:00-5:00 Babies seen by providers at [facility] Accepting Medicaid [facility] Bratton, MD; Coe-Goins, MD; Hayes, MD; Lewis, MD; List, MD; Lott, MD; Marshall, MD; Moran, MD; O'Neal, MD; Pierre-Louis, MD; Pitonzo, MD; Scholer, MD; Spangle, MD [address] [place], [place] [address] [phone] Mon-Fri 8:00-5:30, Sat (Oct.-Mar.) 9:00-1:00 Babies seen by providers at [facility] Accepting Medicaid Must fill out new patient packet, available online at [email or web address] [facility] ([facility]) Friddle, NP; Harris, NP; Kelly, NP; Logan, MD; Melvin, PA; Poth, MD; Ramadoss, MD; Stanton, NP [address], [place], [place] [address] [phone] Mon-Thur 8:00-5:30, Fri 8:00-5:00 Babies seen by providers at [facility] Accepting Medicaid  [place] ([address]) [facility] Dixon, PA; Kalaheo, MD; Pickard, MD; Tapia, PA [address], [place], [place] [address] [phone] Mon-Fri 8:00-5:00 Babies seen by providers at [facility] Accepting Medicaid   [place] ([address]) [facility] [place] Masneri, DO; Meyers, MD; Nelson, PA [address], [place], [place] [address] [phone] Mon-Fri 8:00-5:00 Babies seen by providers at [facility] Does NOT accept Medicaid Limited appointment availability, please call early in hospitalization   [facility] [place] Kunedd, DO; McGowen, MD [address], [place], [place] [address] [phone] Mon-Fri 8:00-5:00 Babies seen by [facility] providers Does NOT accept Medicaid [facility] [place] Cameron, MD; MacDonald, MD; Michaels, PA; Nayak, MD [address] [place] [address], [place], [place] [address] [phone] Mon-Fri 8:00-5:00 After hours clinic ([address] [place], [place] [address]) [phone] Mon-Fri 5:00-8:00, Sat 12:00-6:00, Sun 10:00-4:00 Babies seen by [facility] providers Accepting Medicaid [facility] [place] [address] [place], [place]  [address] [phone]   Fax - [phone]  [place] [phone]) Adult nurse [facility], MD [address] [place], [place], [place] [address] [phone] Mon-Fri 8:00-5:00 Babies seen by [facility] providers Does NOT accept Medicaid [facility] [place] [facility] [place]) Rene Kocher, MD [address] [place], [place] [address] [phone] Mon-Thur 8:00-7:00, Fri 8:00-5:00, Sat 8:00-12:00 Babies seen by providers at [facility] Accepting Medicaid - but does not have vaccinations in office (must be received elsewhere) Limited availability, please call early in hospitalization  [place] [phone]) [facility]  Wyvonne Lenz, MD [address] [place] [address] [phone]  Fax [phone]  [facility]  Lyndel Safe, MD, [place], [place], [place], [place] [address] [place], [place]  [address] [phone] [facility]  [address] Sherian Maroon [place], [place] [address] [phone] [address] [place], [place] [address] [phone] [facility] Office)  [facility] [address] [place], [place] [address] [phone] [facility] [address] [place], [place], [place] [address] [phone] [facility] [address], [place], [place] [address] [phone] [facility] [address], [place], [place] [address] [phone] [facility] [address] [place], [place] [address] [phone] [facility] [address] [place], [place] [address] [phone] [facility]  [address] [place], [place] [address] [phone] Dr. Belia Heman. [place] [address] [place], [place] [address] [phone] [facility] [address], [place], [place] [address] [phone] [facility] [address], [place], [place] [address] [phone] Childbirth Education Options: [facility] Department Classes:  Childbirth education classes can help you get ready for a positive parenting experience. You can also meet other expectant parents and get free stuff for your baby. Each class runs for five weeks on the same night and costs $45 for the mother-to-be and her support person. Medicaid covers the cost if you are eligible. Call [phone] to register. [facility] Childbirth Education: Classes can vary in availability and schedule is subject to change. For most up-to-date information please visit [email or web address] to review and register.    TDaP Vaccine Pregnancy Get the Whooping Cough Vaccine While You Are Pregnant (CDC)  It is important for women to get the whooping cough vaccine in the third trimester of each pregnancy. Vaccines are the best way to prevent this disease. There are 2 different whooping cough vaccines.  Both vaccines combine protection against whooping cough, tetanus and diphtheria, but they are for different age groups: Tdap: for everyone 11 years or older, including pregnant women  DTaP: for children 2 months through 62 years of age  You need the whooping cough vaccine during each of your pregnancies The recommended time to get the shot is during your 27th through 36th week of pregnancy, preferably during the earlier part of this time period. The Centers for Disease Control and Prevention (CDC) recommends that pregnant women receive the whooping cough vaccine for adolescents and adults (called Tdap vaccine) during the third trimester of each pregnancy. The recommended time to get the shot is during your 27th through 36th week of pregnancy, preferably during the earlier part of this time period. This replaces the original recommendation that pregnant women get the vaccine only if they had not previously received it. The [employer] of Obstetricians and Gynecologists and the [employer] support this recommendation.  You should get the whooping cough vaccine while pregnant to pass protection to your baby frame support disabled and/or not supported in this browser  Learn why Vernona Rieger decided to get the whooping cough vaccine in her 3rd trimester of pregnancy and how her baby girl was born with some protection against the disease. Also available on YouTube. After receiving the whooping cough vaccine, your body will create protective antibodies (proteins produced by the body to fight off diseases) and pass some of them to your baby before birth. These antibodies provide your baby some short-term protection against whooping cough in early life. These antibodies can also protect your baby from some of the more serious complications that come along with whooping cough. Your protective antibodies are at their highest about 2 weeks after getting the vaccine, but it takes time to pass  them to your baby. So the preferred time to get the whooping cough vaccine is early in your third trimester. The amount of whooping cough antibodies in your body decreases over time. That is why CDC recommends you get a whooping cough vaccine during each pregnancy. Doing so allows each of your babies to get the greatest number of protective antibodies from you. This means each of your babies will get the best protection possible against this disease.  Getting the whooping cough vaccine while pregnant is better than getting the vaccine after you give birth Whooping cough vaccination during pregnancy is ideal so your baby will have short-term protection as soon as he is born. This early protection is important because your baby will not start getting his whooping cough vaccines until he is 2 months old. These first few months of life are when your baby is at greatest risk for catching whooping cough. This is also when he's at greatest risk for having severe, potentially life-threating complications from the infection. To avoid that gap in protection, it is best to get a whooping cough vaccine during pregnancy. You will then pass protection to your baby before he is born. To continue protecting your baby, he should get whooping cough vaccines starting at 2 months old. You may never have gotten the Tdap vaccine before and did not get it during this pregnancy. If so, you should make sure to get the vaccine immediately after you give birth, before leaving the hospital or birthing center. It will take about 2 weeks before your body develops protection (antibodies) in response to the vaccine. Once you have protection from the vaccine, you are less likely to give whooping cough to your newborn while caring for him. But remember, your baby will still be at risk for catching whooping cough from others. A recent study looked to see how effective Tdap was at preventing whooping cough in babies whose mothers got the vaccine  while pregnant or in the hospital after giving birth. The study found that getting Tdap between 27 through 36 weeks of pregnancy is 85% more effective at preventing whooping cough in babies younger than 2 months old. Blood tests cannot tell if you need a whooping cough vaccine There are no blood tests that can tell you if you have enough antibodies in your body to protect yourself or your baby against whooping cough. Even if you have been sick with whooping cough in the past or previously received the vaccine, you still should get the vaccine during each pregnancy. Breastfeeding may pass some protective antibodies onto your baby By breastfeeding, you may pass some antibodies you have made in response to the  vaccine to your baby. When you get a whooping cough vaccine during your pregnancy, you will have antibodies in your breast milk that you can share with your baby as soon as your milk comes in. However, your baby will not get protective antibodies immediately if you wait to get the whooping cough vaccine until after delivering your baby. This is because it takes about 2 weeks for your body to create antibodies. Learn more about the health benefits of breastfeeding.

## 2021-09-19 ENCOUNTER — Other Ambulatory Visit: Payer: Medicaid Other

## 2021-09-19 ENCOUNTER — Encounter: Payer: Medicaid Other | Admitting: Obstetrics & Gynecology

## 2021-09-20 ENCOUNTER — Encounter: Payer: Medicaid Other | Admitting: Obstetrics & Gynecology

## 2021-09-20 ENCOUNTER — Other Ambulatory Visit: Payer: Medicaid Other

## 2021-09-20 DIAGNOSIS — Z3A26 26 weeks gestation of pregnancy: Secondary | ICD-10-CM

## 2021-09-20 DIAGNOSIS — Z3402 Encounter for supervision of normal first pregnancy, second trimester: Secondary | ICD-10-CM

## 2021-09-20 DIAGNOSIS — Z131 Encounter for screening for diabetes mellitus: Secondary | ICD-10-CM

## 2021-10-04 ENCOUNTER — Ambulatory Visit (INDEPENDENT_AMBULATORY_CARE_PROVIDER_SITE_OTHER): Payer: Medicaid Other | Admitting: Obstetrics & Gynecology

## 2021-10-04 ENCOUNTER — Other Ambulatory Visit: Payer: Medicaid Other

## 2021-10-04 ENCOUNTER — Encounter: Payer: Self-pay | Admitting: Obstetrics & Gynecology

## 2021-10-04 VITALS — BP 130/82 | HR 85 | Wt 169.0 lb

## 2021-10-04 DIAGNOSIS — Z3403 Encounter for supervision of normal first pregnancy, third trimester: Secondary | ICD-10-CM

## 2021-10-04 DIAGNOSIS — Z131 Encounter for screening for diabetes mellitus: Secondary | ICD-10-CM

## 2021-10-04 DIAGNOSIS — Z3480 Encounter for supervision of other normal pregnancy, unspecified trimester: Secondary | ICD-10-CM

## 2021-10-04 DIAGNOSIS — Z3A28 28 weeks gestation of pregnancy: Secondary | ICD-10-CM

## 2021-10-04 NOTE — Progress Notes (Signed)
   LOW-RISK PREGNANCY VISIT Patient name: Jane Duke MRN [id number]  Date of birth: 05-27-2000 Chief Complaint:   Routine Prenatal Visit  History of Present Illness:   Jane Duke is a 21 y.o. G1P0 female at [redacted]w[redacted]d with an Estimated Date of Delivery: 12/20/21 being seen today for ongoing management of a low-risk pregnancy.     06/15/2021    9:29 AM  Depression screen PHQ 2/9  Decreased Interest 1  Down, Depressed, Hopeless 1  PHQ - 2 Score 2  Altered sleeping 0  Tired, decreased energy 1  Change in appetite 0  Feeling bad or failure about yourself  0  Trouble concentrating 0  Moving slowly or fidgety/restless 0  Suicidal thoughts 0  PHQ-9 Score 3    Today she reports no complaints. Contractions: Not present. Vag. Bleeding: None.  Movement: Present. denies leaking of fluid. Review of Systems:   Pertinent items are noted in HPI Denies abnormal vaginal discharge w/ itching/odor/irritation, headaches, visual changes, shortness of breath, chest pain, abdominal pain, severe nausea/vomiting, or problems with urination or bowel movements unless otherwise stated above. Pertinent History Reviewed:  Reviewed past medical,surgical, social, obstetrical and family history.  Reviewed problem list, medications and allergies. Physical Assessment:   Vitals:   10/04/21 1009  BP: 130/82  Pulse: 85  Weight: 169 lb (76.7 kg)  Body mass index is 27.28 kg/m.        Physical Examination:   General appearance: Well appearing, and in no distress  Mental status: Alert, oriented to person, place, and time  Skin: Warm & dry  Cardiovascular: Normal heart rate noted  Respiratory: Normal respiratory effort, no distress  Abdomen: Soft, gravid, nontender  Pelvic: Cervical exam deferred         Extremities: Edema: None  Fetal Status: Fetal Heart Rate (bpm): 140 Fundal Height: 28 cm Movement: Present    Chaperone: n/a    No results found for this or any previous visit (from the past 24  hour(s)).  Assessment & Plan:  1) Low-risk pregnancy G1P0 at [redacted]w[redacted]d with an Estimated Date of Delivery: 12/20/21      Meds: No orders of the defined types were placed in this encounter.  Labs/procedures today:   Plan:  Continue routine obstetrical care  Next visit: prefers in person      Follow-up: Return in about 3 weeks (around 10/25/2021) for LROB.  No orders of the defined types were placed in this encounter.   Lazaro Arms, MD 10/04/2021 10:43 AM

## 2021-10-05 LAB — CBC
Hematocrit: 30.1 % — ABNORMAL LOW (ref 34.0–46.6)
Hemoglobin: 10.1 g/dL — ABNORMAL LOW (ref 11.1–15.9)
MCH: 31 pg (ref 26.6–33.0)
MCHC: 33.6 g/dL (ref 31.5–35.7)
MCV: 92 fL (ref 79–97)
Platelets: 210 10*3/uL (ref 150–450)
RBC: 3.26 x10E6/uL — ABNORMAL LOW (ref 3.77–5.28)
RDW: 12 % (ref 11.7–15.4)
WBC: 10.7 10*3/uL (ref 3.4–10.8)

## 2021-10-05 LAB — GLUCOSE TOLERANCE, 2 HOURS W/ 1HR
Glucose, 1 hour: 72 mg/dL (ref 70–179)
Glucose, 2 hour: 71 mg/dL (ref 70–152)
Glucose, Fasting: 86 mg/dL (ref 70–91)

## 2021-10-05 LAB — HIV ANTIBODY (ROUTINE TESTING W REFLEX): HIV Screen 4th Generation wRfx: NONREACTIVE

## 2021-10-05 LAB — ANTIBODY SCREEN: Antibody Screen: NEGATIVE

## 2021-10-05 LAB — RPR: RPR Ser Ql: NONREACTIVE

## 2021-10-21 ENCOUNTER — Other Ambulatory Visit: Payer: Self-pay

## 2021-10-21 ENCOUNTER — Encounter (HOSPITAL_COMMUNITY): Payer: Self-pay | Admitting: *Deleted

## 2021-10-21 ENCOUNTER — Emergency Department (HOSPITAL_COMMUNITY)
Admission: EM | Admit: 2021-10-21 | Discharge: 2021-10-21 | Discharge status: Against Medical Advice | Payer: Medicaid Other | Attending: Emergency Medicine | Admitting: Emergency Medicine

## 2021-10-21 DIAGNOSIS — Z7982 Long term (current) use of aspirin: Secondary | ICD-10-CM | POA: Diagnosis not present

## 2021-10-21 DIAGNOSIS — Z79899 Other long term (current) drug therapy: Secondary | ICD-10-CM | POA: Insufficient documentation

## 2021-10-21 DIAGNOSIS — O26893 Other specified pregnancy related conditions, third trimester: Secondary | ICD-10-CM | POA: Insufficient documentation

## 2021-10-21 DIAGNOSIS — Z3A30 30 weeks gestation of pregnancy: Secondary | ICD-10-CM | POA: Insufficient documentation

## 2021-10-21 LAB — URINALYSIS, ROUTINE W REFLEX MICROSCOPIC
Bilirubin Urine: NEGATIVE
Glucose, UA: NEGATIVE mg/dL
Hgb urine dipstick: NEGATIVE
Ketones, ur: 5 mg/dL — AB
Leukocytes,Ua: NEGATIVE
Nitrite: NEGATIVE
Protein, ur: NEGATIVE mg/dL
Specific Gravity, Urine: 1.009 (ref 1.005–1.030)
pH: 7 (ref 5.0–8.0)

## 2021-10-21 NOTE — Progress Notes (Signed)
Spoke with Dr. Shawnie Pons. Says the pt will be transferred here to [facility] if the [facility] priovider cannot determine if the pt's membranes are ruptured.

## 2021-10-21 NOTE — ED Notes (Signed)
Patient states G1 P0 AO Patient states EDD 12/20/2021 so she is 32 weeks. Patient states fluid leaked out while urinating. Patient states she came straight here due to abdominal cramping at 5/10.  FHR 138 on TOCO Jennifer at [facility] is here Web designer.  Mary to monitor patient remotely with toco.

## 2021-10-21 NOTE — ED Triage Notes (Addendum)
Pt with lower back pain and cramping to lower abd x 30 min.  Per Pt is 31 wks 3 days pregnant, pt states this is her first pregnancy.  Last OB visit 2 weeks ago and normal per pt. ? Vaginal discharge

## 2021-10-21 NOTE — Progress Notes (Signed)
Dr. Shawnie Pons has spoken to the [facility] provider and the pt will be transferred here to MAU for further evaluation.

## 2021-10-21 NOTE — ED Notes (Signed)
[facility] called to setup transportation to [facility]U at this time.

## 2021-10-21 NOTE — ED Notes (Signed)
Called to cancel [facility] transportation at this time.

## 2021-10-21 NOTE — Progress Notes (Signed)
Received call from [facility] RN, Asher Muir. Pt is a G1P0 at 52 3/[redacted] weeks gestation presenting with complaints of leaking fluid after voiding.She is also complaining of lower abd pain that she rates a 5 out of 10. No vaginal bleeding.

## 2021-10-21 NOTE — ED Notes (Signed)
Patient taken off TOCO to use the restroom at this time.

## 2021-10-21 NOTE — Progress Notes (Signed)
Spoke with Lestine Mount. The provider has not assessed the pt for rupture of membranes. Asher Muir says that the pt is not as uncomfortable as she was when she first came in. The pt has not complained of any vaginal bleeding.

## 2021-10-21 NOTE — ED Notes (Signed)
Patient states she wants to leave this hospital and drive herself to the [facility] [place], [place].

## 2021-10-21 NOTE — ED Provider Notes (Signed)
[facility] EMERGENCY DEPARTMENT Provider Note   CSN: [id number] Arrival date & time: 10/21/21  1112     History  Chief Complaint  Patient presents with   Back Pain    Jane Duke is a 20 y.o. female.  HPI G1, P0 adult female at approximately 30 weeks pregnancy presents with abdominal pain intermittently as well as an episode of possible fluid leakage.  She notes that she has had intermittent abdominal crampiness, nonsustained no rhythmic recently, but today, after an episode of going to the bathroom she felt a fluid collection in her underwear.  There is no discrete pool of liquid, but given the combination of pain and this she presents for evaluation.  She is otherwise well.  She is joined by her boyfriend and boyfriend's mother.  She notes that she gets her care here locally at pain treatment, has had ultrasound has unremarkable pregnancy thus far, reportedly.    Home Medications Prior to Admission medications   Medication Sig Start Date End Date Taking? Authorizing Provider  aspirin 81 MG EC tablet Take 1 tablet (81 mg total) by mouth daily. Swallow whole. Patient not taking: Reported on 07/06/2021 06/15/21   Cheral Marker, CNM  Blood Pressure Monitor MISC For regular home bp monitoring during pregnancy 06/15/21   Cheral Marker, CNM  Ferrous Sulfate (IRON PO) Take by mouth.    [provider]  Prenatal Vit-Fe Fumarate-FA (PRENATAL VITAMIN PO) Take by mouth.    [provider]      Allergies    Patient has no known allergies.    Review of Systems   Review of Systems  All other systems reviewed and are negative.   Physical Exam Updated Vital Signs BP (!) 132/109   Pulse 77   Temp (!) 97.5 F (36.4 C) (Oral)   Resp 18   Ht 5\' 6"  (1.676 m)   Wt 77.6 kg   LMP 03/24/2021   SpO2 100%   BMI 27.60 kg/m  Physical Exam Vitals and nursing note reviewed. Exam conducted with a chaperone present.  Constitutional:      General: She is not in acute  distress.    Appearance: She is well-developed.  HENT:     Head: Normocephalic and atraumatic.  Eyes:     Conjunctiva/sclera: Conjunctivae normal.  Cardiovascular:     Rate and Rhythm: Normal rate and regular rhythm.  Pulmonary:     Effort: Pulmonary effort is normal. No respiratory distress.     Breath sounds: Normal breath sounds. No stridor.  Abdominal:     General: There is no distension.     Comments: Gravid abdomen, nontender  Genitourinary:   Skin:    General: Skin is warm and dry.  Neurological:     Mental Status: She is alert and oriented to person, place, and time.     Cranial Nerves: No cranial nerve deficit.  Psychiatric:        Mood and Affect: Mood normal.     ED Results / Procedures / Treatments   Labs (all labs ordered are listed, but only abnormal results are displayed) Labs Reviewed  URINALYSIS, ROUTINE W REFLEX MICROSCOPIC - Abnormal; Notable for the following components:      Result Value   Ketones, ur 5 (*)    All other components within normal limits    EKG None  Radiology No results found.  Procedures Procedures    Medications Ordered in ED Medications - No data to display  ED Course/  Medical Decision Making/ A&P This patient with a Hx of third trimester pregnancy presents to the ED for concern of abdominal pain, possible fluid leakage, possible rupture membranes, this involves an extensive number of treatment options, and is a complaint that carries with it a high risk of complications and morbidity.    The differential diagnosis includes premature rupture of membranes, premature delivery with contractions, complication of pregnancy, urinary tract infection, dehydration   Social Determinants of Health:  Youth  Additional history obtained:  Additional history and/or information obtained from boyfriend, friends mother at bedside, description of HPI   After the initial evaluation, orders, including: Continuous tocometry were  initiated.   Patient placed on Cardiac and Pulse-Oximetry Monitors. The patient was maintained on a cardiac monitor.  The cardiac monitored showed an rhythm of 75 sinus normal The patient was also maintained on pulse oximetry. The readings were typically 100% room air normal Tocometry readings monitored, variable somewhat, rate 140s, no appreciable contractions   On repeat evaluation of the patient stayed the same  Lab Tests:  I personally interpreted labs.  The pertinent results include: No evidence for urinary tract infection    Consultations Obtained:  I requested consultation with the OB rapid response after the patient was placed on tocometry initially, and was monitored remotely.  I discussed the patient's presentation, physical exam, without definitive proof that this was not appropriate for her rupture membranes patient was accepted in transfer to our maternal admission unit.  No early evidence for premature delivery, nor bacteremia, sepsis or infection.    Dispostion / Final MDM:  After consideration of the diagnostic results and the patient's response to treatment, patient has recommendation for transfer to our maternal admissions unit, which I discussed with our OB team, as above.  On discussing results with the patient, while her boyfriend was on the phone, reportedly the father of the fetus, patient declined transfer, stated that she is hungry, wants to leave.  I reiterated our medical recommendation for transfer given her third trimester pregnancy presentation with possible PROM, and ongoing abdominal pain.  Patient again declined transfer via ambulance, states that she will like to leave, may present herself to our affiliated facility.  Patient has capacity to make this request for departure AGAINST MEDICAL ADVICE, and it was accommodated.  Final Clinical Impression(s) / ED Diagnoses Final diagnoses:  Abdominal pain during pregnancy in third trimester    Rx / DC  Orders ED Discharge Orders     None         Gerhard Munch, MD 10/21/21 1457

## 2021-10-21 NOTE — Discharge Instructions (Signed)
As we discussed, although you have elected to leave AGAINST MEDICAL ADVICE and recommendation for transfer to our affiliated facility, the recommendation remains to go to our maternal assessment unit at [facility].  Return here for concerning changes in your condition.

## 2021-10-25 ENCOUNTER — Encounter: Payer: Medicaid Other | Admitting: Advanced Practice Midwife

## 2021-10-26 ENCOUNTER — Encounter: Payer: Self-pay | Admitting: Medical

## 2021-10-26 ENCOUNTER — Ambulatory Visit (INDEPENDENT_AMBULATORY_CARE_PROVIDER_SITE_OTHER): Payer: Medicaid Other | Admitting: Medical

## 2021-10-26 VITALS — BP 124/75 | HR 79 | Wt 170.0 lb

## 2021-10-26 DIAGNOSIS — Z3A32 32 weeks gestation of pregnancy: Secondary | ICD-10-CM

## 2021-10-26 DIAGNOSIS — Z3403 Encounter for supervision of normal first pregnancy, third trimester: Secondary | ICD-10-CM

## 2021-10-26 NOTE — Progress Notes (Signed)
   PRENATAL VISIT NOTE  Subjective:  Jane Duke is a 21 y.o. G1P0 at [redacted]w[redacted]d being seen today for ongoing prenatal care.  She is currently monitored for the following issues for this low-risk pregnancy and has Supervision of normal first pregnancy on their problem list.  Patient reports occasional contractions.  Contractions: Not present. Vag. Bleeding: None.  Movement: Present. Denies leaking of fluid.   The following portions of the patient's history were reviewed and updated as appropriate: allergies, current medications, past family history, past medical history, past social history, past surgical history and problem list.   Objective:   Vitals:   10/26/21 1054  BP: 124/75  Pulse: 79  Weight: 170 lb (77.1 kg)    Fetal Status: Fetal Heart Rate (bpm): 145 Fundal Height: 32 cm Movement: Present     General:  Alert, oriented and cooperative. Patient is in no acute distress.  Skin: Skin is warm and dry. No rash noted.   Cardiovascular: Normal heart rate noted  Respiratory: Normal respiratory effort, no problems with respiration noted  Abdomen: Soft, gravid, appropriate for gestational age.  Pain/Pressure: Present     Pelvic: Cervical exam deferred        Extremities: Normal range of motion.  Edema: None  Mental Status: Normal mood and affect. Normal behavior. Normal judgment and thought content.   Assessment and Plan:  Pregnancy: G1P0 at [redacted]w[redacted]d 1. Encounter for supervision of normal first pregnancy in third trimester - Has Peds list  - Considering TDAP at next visit   2. [redacted] weeks gestation of pregnancy  Preterm labor symptoms and general obstetric precautions including but not limited to vaginal bleeding, contractions, leaking of fluid and fetal movement were reviewed in detail with the patient. Please refer to After Visit Summary for other counseling recommendations.   Return in about 2 weeks (around 11/09/2021) for LOB, In-Person.  No future appointments.  Vonzella Nipple,  PA-C

## 2021-11-12 ENCOUNTER — Ambulatory Visit (INDEPENDENT_AMBULATORY_CARE_PROVIDER_SITE_OTHER): Payer: Medicaid Other | Admitting: Women's Health

## 2021-11-12 ENCOUNTER — Encounter: Payer: Self-pay | Admitting: Women's Health

## 2021-11-12 VITALS — BP 132/79 | HR 87 | Wt 174.0 lb

## 2021-11-12 DIAGNOSIS — Z3403 Encounter for supervision of normal first pregnancy, third trimester: Secondary | ICD-10-CM

## 2021-11-12 DIAGNOSIS — Z3A34 34 weeks gestation of pregnancy: Secondary | ICD-10-CM | POA: Diagnosis not present

## 2021-11-12 DIAGNOSIS — Z23 Encounter for immunization: Secondary | ICD-10-CM | POA: Diagnosis not present

## 2021-11-12 NOTE — Progress Notes (Signed)
    LOW-RISK PREGNANCY VISIT Patient name: Jane Duke MRN [id number]  Date of birth: February 07, 2001 Chief Complaint:   Routine Prenatal Visit  History of Present Illness:   Jane Duke is a 21 y.o. G1P0 female at [redacted]w[redacted]d with an Estimated Date of Delivery: 12/20/21 being seen today for ongoing management of a low-risk pregnancy.   Today she reports no complaints. Contractions: Not present. Vag. Bleeding: None.  Movement: Present. denies leaking of fluid.     10/04/2021   10:43 AM 06/15/2021    9:29 AM  Depression screen PHQ 2/9  Decreased Interest 1 1  Down, Depressed, Hopeless 1 1  PHQ - 2 Score 2 2  Altered sleeping 1 0  Tired, decreased energy 1 1  Change in appetite 0 0  Feeling bad or failure about yourself  0 0  Trouble concentrating 0 0  Moving slowly or fidgety/restless 1 0  Suicidal thoughts 0 0  PHQ-9 Score 5 3        10/04/2021   10:44 AM 06/15/2021    9:31 AM  GAD 7 : Generalized Anxiety Score  Nervous, Anxious, on Edge 1 0  Control/stop worrying 0 0  Worry too much - different things 0 1  Trouble relaxing 0 1  Restless 1 1  Easily annoyed or irritable 1 1  Afraid - awful might happen 0 0  Total GAD 7 Score 3 4      Review of Systems:   Pertinent items are noted in HPI Denies abnormal vaginal discharge w/ itching/odor/irritation, headaches, visual changes, shortness of breath, chest pain, abdominal pain, severe nausea/vomiting, or problems with urination or bowel movements unless otherwise stated above. Pertinent History Reviewed:  Reviewed past medical,surgical, social, obstetrical and family history.  Reviewed problem list, medications and allergies. Physical Assessment:   Vitals:   11/12/21 1015  BP: 132/79  Pulse: 87  Weight: 174 lb (78.9 kg)  Body mass index is 28.08 kg/m.        Physical Examination:   General appearance: Well appearing, and in no distress  Mental status: Alert, oriented to person, place, and time  Skin: Warm &  dry  Cardiovascular: Normal heart rate noted  Respiratory: Normal respiratory effort, no distress  Abdomen: Soft, gravid, nontender  Pelvic: Cervical exam deferred         Extremities: Edema: None  Fetal Status: Fetal Heart Rate (bpm): 140 Fundal Height: 33 cm Movement: Present    Chaperone: N/A   No results found for this or any previous visit (from the past 24 hour(s)).  Assessment & Plan:  1) Low-risk pregnancy G1P0 at [redacted]w[redacted]d with an Estimated Date of Delivery: 12/20/21    Meds: No orders of the defined types were placed in this encounter.  Labs/procedures today: tdap  Plan:  Continue routine obstetrical care  Next visit: prefers will be in person for cultures     Reviewed: Preterm labor symptoms and general obstetric precautions including but not limited to vaginal bleeding, contractions, leaking of fluid and fetal movement were reviewed in detail with the patient.  All questions were answered. Does have home bp cuff. Office bp cuff given: not applicable. Check bp daily, let us know if consistently >140 and/or >90.  Follow-up: Return in about 2 weeks (around 11/26/2021) for LROB, CNM, in person.  No future appointments.  No orders of the defined types were placed in this encounter.  Cheral Marker CNM, [facility] 11/12/2021 10:27 AM

## 2021-11-12 NOTE — Patient Instructions (Signed)
[place], thank you for choosing our office today! We appreciate the opportunity to meet your healthcare needs. You may receive a short survey by mail, e-mail, or through [employer]. If you are happy with your care we would appreciate if you could take just a few minutes to complete the survey questions. We read all of your comments and take your feedback very seriously. Thank you again for choosing our office.  Center for [employer] Team at [facility]  [facility] at [facility] ([address] [place], [place] [address]) Entrance C, located off of [address] Free 24/7 valet parking   CLASSES: Go to [email or web address] to register for classes (childbirth, breastfeeding, waterbirth, infant CPR, daddy bootcamp, etc.)  Call the office [phone]) or go to [facility] if: You begin to have strong, frequent contractions Your water breaks.  Sometimes it is a big gush of fluid, sometimes it is just a trickle that keeps getting your panties wet or running down your legs You have vaginal bleeding.  It is normal to have a small amount of spotting if your cervix was checked.  You don't feel your baby moving like normal.  If you don't, get you something to eat and drink and lay down and focus on feeling your baby move.   If your baby is still not moving like normal, you should call the office or go to [facility].  Call the office [phone]) or go to [facility] hospital for these signs of pre-eclampsia: Severe headache that does not go away with Tylenol Visual changes- seeing spots, double, blurred vision Pain under your right breast or upper abdomen that does not go away with Tums or heartburn medicine Nausea and/or vomiting Severe swelling in your hands, feet, and face   Tdap Vaccine It is recommended that you get the Tdap vaccine during the third trimester of EACH pregnancy to help protect your baby from getting pertussis (whooping cough) 27-36 weeks is the BEST time to do  this so that you can pass the protection on to your baby. During pregnancy is better than after pregnancy, but if you are unable to get it during pregnancy it will be offered at the hospital.  You can get this vaccine with [place], at the health department, your family doctor, or some local pharmacies Everyone who will be around your baby should also be up-to-date on their vaccines before the baby comes. Adults (who are not pregnant) only need 1 dose of Tdap during adulthood.   [facility] Pediatricians/Family Doctors  [facility]): [address] Colette Ribas, [phone]           [facility]: [address], [phone]                [facility]): [address], [phone] (call to ask if accepting patients) [facility] Department: [address], [place], [phone]    [facility] Pediatricians/Family Doctors [facility]): [phone] [address], [phone] [facility]: [address] [place], [phone] [facility]: [address], [phone]  [facility] Doctors  [facility]): [phone] [facility]: [address] [phone]   [facility] Doctors [facility]: [address], [phone]  [facility] Family Doctors  [facility]: [phone], [phone]  Home Blood Pressure Monitoring for Patients   Your provider has recommended that you check your  blood pressure (BP) at least once a week at home. If you do not have a blood pressure cuff at home, one will be provided for you. Contact your provider if you have not received your monitor within 1 week.   Helpful Tips for Accurate Home Blood Pressure Checks  Don't smoke, exercise, or drink caffeine 30 minutes before checking your BP Use the restroom before checking your BP (a full bladder can raise your  pressure) Relax in a comfortable upright chair Feet on the ground Left arm resting comfortably on a flat surface at the level of your heart Legs uncrossed Back supported Sit quietly and don't talk Place the cuff on your bare arm Adjust snuggly, so that only two fingertips can fit between your skin and the top of the cuff Check 2 readings separated by at least one minute Keep a log of your BP readings For a visual, please reference this diagram: http://ccnc.care/bpdiagram  Provider Name: [facility]     Phone: [phone]  Zone 1: ALL CLEAR  Continue to monitor your symptoms:  BP reading is less than 140 (top number) or less than 90 (bottom number)  No right upper stomach pain No headaches or seeing spots No feeling nauseated or throwing up No swelling in face and hands  Zone 2: CAUTION Call your doctor's office for any of the following:  BP reading is greater than 140 (top number) or greater than 90 (bottom number)  Stomach pain under your ribs in the middle or right side Headaches or seeing spots Feeling nauseated or throwing up Swelling in face and hands  Zone 3: EMERGENCY  Seek immediate medical care if you have any of the following:  BP reading is greater than160 (top number) or greater than 110 (bottom number) Severe headaches not improving with Tylenol Serious difficulty catching your breath Any worsening symptoms from Zone 2  Preterm Labor and Birth Information  The normal length of a pregnancy is 39-41 weeks. Preterm labor is when labor starts before 37 completed weeks of pregnancy. What are the risk factors for preterm labor? Preterm labor is more likely to occur in women who: Have certain infections during pregnancy such as a bladder infection, sexually transmitted infection, or infection inside the uterus (chorioamnionitis). Have a shorter-than-normal cervix. Have gone into preterm labor before. Have had surgery on their cervix. Are younger than age 17  or older than age 35. Are African American. Are pregnant with twins or multiple babies (multiple gestation). Take street drugs or smoke while pregnant. Do not gain enough weight while pregnant. Became pregnant shortly after having been pregnant. What are the symptoms of preterm labor? Symptoms of preterm labor include: Cramps similar to those that can happen during a menstrual period. The cramps may happen with diarrhea. Pain in the abdomen or lower back. Regular uterine contractions that may feel like tightening of the abdomen. A feeling of increased pressure in the pelvis. Increased watery or bloody mucus discharge from the vagina. Water breaking (ruptured amniotic sac). Why is it important to recognize signs of preterm labor? It is important to recognize signs of preterm labor because babies who are born prematurely may not be fully developed. This can put them at an increased risk for: Long-term (chronic) heart and lung problems. Difficulty immediately after birth with regulating body systems, including blood sugar, body temperature, heart rate, and breathing rate. Bleeding in the brain. Cerebral palsy. Learning difficulties. Death. These risks are highest for babies who are born before 34 weeks   of pregnancy. How is preterm labor treated? Treatment depends on the length of your pregnancy, your condition, and the health of your baby. It may involve: Having a stitch (suture) placed in your cervix to prevent your cervix from opening too early (cerclage). Taking or being given medicines, such as: Hormone medicines. These may be given early in pregnancy to help support the pregnancy. Medicine to stop contractions. Medicines to help mature the baby's lungs. These may be prescribed if the risk of delivery is high. Medicines to prevent your baby from developing cerebral palsy. If the labor happens before 34 weeks of pregnancy, you may need to stay in the hospital. What should I do if I  think I am in preterm labor? If you think that you are going into preterm labor, call your health care provider right away. How can I prevent preterm labor in future pregnancies? To increase your chance of having a full-term pregnancy: Do not use any tobacco products, such as cigarettes, chewing tobacco, and e-cigarettes. If you need help quitting, ask your health care provider. Do not use street drugs or medicines that have not been prescribed to you during your pregnancy. Talk with your health care provider before taking any herbal supplements, even if you have been taking them regularly. Make sure you gain a healthy amount of weight during your pregnancy. Watch for infection. If you think that you might have an infection, get it checked right away. Make sure to tell your health care provider if you have gone into preterm labor before. This information is not intended to replace advice given to you by your health care provider. Make sure you discuss any questions you have with your health care provider. Document Revised: 06/12/2018 Document Reviewed: 07/12/2015 Elsevier Patient Education  2020 [employer]   

## 2021-11-26 ENCOUNTER — Encounter: Payer: Self-pay | Admitting: Women's Health

## 2021-11-26 ENCOUNTER — Ambulatory Visit (INDEPENDENT_AMBULATORY_CARE_PROVIDER_SITE_OTHER): Payer: Medicaid Other | Admitting: Women's Health

## 2021-11-26 ENCOUNTER — Other Ambulatory Visit (HOSPITAL_COMMUNITY)
Admission: RE | Admit: 2021-11-26 | Discharge: 2021-11-26 | Disposition: A | Discharge status: Home / Self Care | Payer: Medicaid Other | Source: Ambulatory Visit | Attending: Women's Health | Admitting: Women's Health

## 2021-11-26 VITALS — BP 133/82 | HR 84 | Wt 174.5 lb

## 2021-11-26 DIAGNOSIS — Z3403 Encounter for supervision of normal first pregnancy, third trimester: Secondary | ICD-10-CM | POA: Insufficient documentation

## 2021-11-26 DIAGNOSIS — Z3A36 36 weeks gestation of pregnancy: Secondary | ICD-10-CM

## 2021-11-26 NOTE — Progress Notes (Signed)
LOW-RISK PREGNANCY VISIT Patient name: Jane Duke MRN [id number]  Date of birth: 01-Dec-2000 Chief Complaint:   Routine Prenatal Visit  History of Present Illness:   Jane Duke is a 21 y.o. G1P0 female at [redacted]w[redacted]d with an Estimated Date of Delivery: 12/20/21 being seen today for ongoing management of a low-risk pregnancy.   Today she reports no complaints. Contractions: Not present. Vag. Bleeding: None.  Movement: Present. denies leaking of fluid.     10/04/2021   10:43 AM 06/15/2021    9:29 AM  Depression screen PHQ 2/9  Decreased Interest 1 1  Down, Depressed, Hopeless 1 1  PHQ - 2 Score 2 2  Altered sleeping 1 0  Tired, decreased energy 1 1  Change in appetite 0 0  Feeling bad or failure about yourself  0 0  Trouble concentrating 0 0  Moving slowly or fidgety/restless 1 0  Suicidal thoughts 0 0  PHQ-9 Score 5 3        10/04/2021   10:44 AM 06/15/2021    9:31 AM  GAD 7 : Generalized Anxiety Score  Nervous, Anxious, on Edge 1 0  Control/stop worrying 0 0  Worry too much - different things 0 1  Trouble relaxing 0 1  Restless 1 1  Easily annoyed or irritable 1 1  Afraid - awful might happen 0 0  Total GAD 7 Score 3 4      Review of Systems:   Pertinent items are noted in HPI Denies abnormal vaginal discharge w/ itching/odor/irritation, headaches, visual changes, shortness of breath, chest pain, abdominal pain, severe nausea/vomiting, or problems with urination or bowel movements unless otherwise stated above. Pertinent History Reviewed:  Reviewed past medical,surgical, social, obstetrical and family history.  Reviewed problem list, medications and allergies. Physical Assessment:   Vitals:   11/26/21 1421  BP: 133/82  Pulse: 84  Weight: 174 lb 8 oz (79.2 kg)  Body mass index is 28.17 kg/m.        Physical Examination:   General appearance: Well appearing, and in no distress  Mental status: Alert, oriented to person, place, and time  Skin: Warm &  dry  Cardiovascular: Normal heart rate noted  Respiratory: Normal respiratory effort, no distress  Abdomen: Soft, gravid, nontender  Pelvic: Cervical exam deferred  Dilation: Closed Effacement (%): Thick Station: Ballotable  Extremities: Edema: None  Fetal Status: Fetal Heart Rate (bpm): 140 Fundal Height: 35 cm Movement: Present Presentation: Vertex  Chaperone: Latisha Cresenzo   No results found for this or any previous visit (from the past 24 hour(s)).  Assessment & Plan:  1) Low-risk pregnancy G1P0 at [redacted]w[redacted]d with an Estimated Date of Delivery: 12/20/21    Meds: No orders of the defined types were placed in this encounter.  Labs/procedures today: gbs, gc/ct, sve  Plan:  Continue routine obstetrical care  Next visit: prefers in person    Reviewed: Preterm labor symptoms and general obstetric precautions including but not limited to vaginal bleeding, contractions, leaking of fluid and fetal movement were reviewed in detail with the patient.  All questions were answered. Does have home bp cuff. Office bp cuff given: not applicable. Check bp daily, let us know if consistently >140 and/or >90.  Follow-up: Return for weekly, As scheduled.  Future Appointments  Date Time Provider [place]  12/03/2021  2:30 PM Roma Schanz, [place] [facility]  12/10/2021 11:50 AM Roma Schanz, CNM [facility]  12/17/2021  2:30 PM Cresenzo-Dishmon, Joaquim Lai, CNM [facility]  No orders of the defined types were placed in this encounter.  Cheral Marker CNM, [facility] 11/26/2021 2:43 PM

## 2021-11-26 NOTE — Addendum Note (Signed)
Addended by: Jesusita Oka on: 11/26/2021 04:20 PM   Modules accepted: Orders

## 2021-11-26 NOTE — Patient Instructions (Signed)
Kathi, thank you for choosing our office today! We appreciate the opportunity to meet your healthcare needs. You may receive a short survey by mail, e-mail, or through [employer]. If you are happy with your care we would appreciate if you could take just a few minutes to complete the survey questions. We read all of your comments and take your feedback very seriously. Thank you again for choosing our office.  Center [facility] [employer] Team at [place] at [facility] ([place], [place] [address]) Entrance C, located off of [address] parking   CLASSES: Go to [email or web address] to register for classes (childbirth, breastfeeding, waterbirth, infant CPR, daddy bootcamp, etc.)  Call the office [phone]) or go to [facility] if: You begin to have strong, frequent contractions Your water breaks.  Sometimes it is a big gush of fluid, sometimes it is just a trickle that keeps getting your panties wet or running down your legs You have vaginal bleeding.  It is normal to have a small amount of spotting if your cervix was checked.  You don't feel your baby moving like normal.  If you don't, get you something to eat and drink and lay down and focus on feeling your baby move.   If your baby is still not moving like normal, you should call the office or go to [facility].  Call the office [phone]) or go to [facility] hospital for these signs of pre-eclampsia: Severe headache that does not go away with Tylenol Visual changes- seeing spots, double, blurred vision Pain under your right breast or upper abdomen that does not go away with Tums or heartburn medicine Nausea and/or vomiting Severe swelling in your hands, feet, and face   [facility] Pediatricians/Family Doctors [facility]): [address] [place], [place]: [address] [place], [place] [facility]): [place], [phone] (call to ask if accepting patients) [facility] Department: [address] [place], [facility]): [address], [place] [facility]: [address] [place], [place] [facility]: [place], [place] [facility]): [phone] [facility]: [address], [place]: [address], [place] [facility]: [phone], [phone]  Home Blood Pressure Monitoring for Patients   Your provider has recommended that you check your blood pressure (BP) at least once a week at home. If you do not have a blood pressure cuff at home, one will be provided for you. Contact your provider if you have not received your monitor within 1 week.   Helpful Tips for Accurate Home Blood Pressure Checks  Don't smoke, exercise, or drink caffeine 30 minutes before checking your BP Use the restroom before checking your BP (a full bladder can raise your pressure) Relax in a comfortable upright chair Feet on the ground Left arm resting comfortably on a flat surface at the level of your heart Legs uncrossed Back supported Sit quietly and don't talk Place the cuff on your bare arm Adjust snuggly, so that only two fingertips  can fit between your skin and the top of the cuff Check 2 readings separated by at least one minute Keep a log of your BP readings For a visual, please reference this diagram: http://ccnc.care/bpdiagram  Provider Name: [facility]     Phone: [phone]  Zone 1: ALL CLEAR  Continue to monitor your symptoms:  BP reading is less than 140 (top number) or less than 90 (bottom number)  No right  upper stomach pain No headaches or seeing spots No feeling nauseated or throwing up No swelling in face and hands  Zone 2: CAUTION Call your doctor's office for any of the following:  BP reading is greater than 140 (top number) or greater than 90 (bottom number)  Stomach pain under your ribs in the middle or right side Headaches or seeing spots Feeling nauseated or throwing up Swelling in face and hands  Zone 3: EMERGENCY  Seek immediate medical care if you have any of the following:  BP reading is greater than160 (top number) or greater than 110 (bottom number) Severe headaches not improving with Tylenol Serious difficulty catching your breath Any worsening symptoms from Zone 2   Braxton Hicks Contractions Contractions of the uterus can occur throughout pregnancy, but they are not always a sign that you are in labor. You may have practice contractions called Braxton Hicks contractions. These false labor contractions are sometimes confused with true labor. What are [place] contractions? Braxton Hicks contractions are tightening movements that occur in the muscles of the uterus before labor. Unlike true labor contractions, these contractions do not result in opening (dilation) and thinning of the cervix. Toward the end of pregnancy (32-34 weeks), Braxton Hicks contractions can happen more often and may become stronger. These contractions are sometimes difficult to tell apart from true labor because they can be very uncomfortable. You should not feel embarrassed if you go to the hospital with false labor. Sometimes, the only way to tell if you are in true labor is for your health care provider to look for changes in the cervix. The health care provider will do a physical exam and may monitor your contractions. If you are not in true labor, the exam should show that your cervix is not dilating and your water has not broken. If there are no other health problems associated with your  pregnancy, it is completely safe for you to be sent home with false labor. You may continue to have Braxton Hicks contractions until you go into true labor. How to tell the difference between true labor and false labor True labor Contractions last 30-70 seconds. Contractions become very regular. Discomfort is usually felt in the top of the uterus, and it spreads to the lower abdomen and low back. Contractions do not go away with walking. Contractions usually become more intense and increase in frequency. The cervix dilates and gets thinner. False labor Contractions are usually shorter and not as strong as true labor contractions. Contractions are usually irregular. Contractions are often felt in the front of the lower abdomen and in the groin. Contractions may go away when you walk around or change positions while lying down. Contractions get weaker and are shorter-lasting as time goes on. The cervix usually does not dilate or become thin. Follow these instructions at home:  Take over-the-counter and prescription medicines only as told by your health care provider. Keep up with your usual exercises and follow other instructions from your health care provider. Eat and drink lightly if you think  you are going into labor. If Braxton Hicks contractions are making you uncomfortable: Change your position from lying down or resting to walking, or change from walking to resting. Sit and rest in a tub of warm water. Drink enough fluid to keep your urine pale yellow. Dehydration may cause these contractions. Do slow and deep breathing several times an hour. Keep all follow-up prenatal visits as told by your health care provider. This is important. Contact a health care provider if: You have a fever. You have continuous pain in your abdomen. Get help right away if: Your contractions become stronger, more regular, and closer together. You have fluid leaking or gushing from your vagina. You pass  blood-tinged mucus (bloody show). You have bleeding from your vagina. You have low back pain that you never had before. You feel your baby's head pushing down and causing pelvic pressure. Your baby is not moving inside you as much as it used to. Summary Contractions that occur before labor are called Braxton Hicks contractions, false labor, or practice contractions. Braxton Hicks contractions are usually shorter, weaker, farther apart, and less regular than true labor contractions. True labor contractions usually become progressively stronger and regular, and they become more frequent. Manage discomfort from [facility] contractions by changing position, resting in a warm bath, drinking plenty of water, or practicing deep breathing. This information is not intended to replace advice given to you by your health care provider. Make sure you discuss any questions you have with your health care provider. Document Revised: 01/31/2017 Document Reviewed: 07/04/2016 Elsevier Patient Education  [place].

## 2021-11-28 LAB — CERVICOVAGINAL ANCILLARY ONLY
Chlamydia: NEGATIVE
Comment: NEGATIVE
Comment: NORMAL
Neisseria Gonorrhea: NEGATIVE

## 2021-11-30 LAB — CULTURE, BETA STREP (GROUP B ONLY): Strep Gp B Culture: NEGATIVE

## 2021-12-03 ENCOUNTER — Ambulatory Visit (INDEPENDENT_AMBULATORY_CARE_PROVIDER_SITE_OTHER): Payer: Medicaid Other | Admitting: Women's Health

## 2021-12-03 ENCOUNTER — Encounter: Payer: Self-pay | Admitting: Women's Health

## 2021-12-03 VITALS — BP 135/78 | HR 98 | Wt 172.2 lb

## 2021-12-03 DIAGNOSIS — Z3403 Encounter for supervision of normal first pregnancy, third trimester: Secondary | ICD-10-CM

## 2021-12-03 NOTE — Progress Notes (Signed)
LOW-RISK PREGNANCY VISIT Patient name: Jane Duke MRN [id number]  Date of birth: 24-Aug-2000 Chief Complaint:   Routine Prenatal Visit  History of Present Illness:   Reylene Stauder is a 21 y.o. G1P0 female at [redacted]w[redacted]d with an Estimated Date of Delivery: 12/20/21 being seen today for ongoing management of a low-risk pregnancy.   Today she reports no complaints. Contractions: Not present. Vag. Bleeding: None.  Movement: Present. denies leaking of fluid.     10/04/2021   10:43 AM 06/15/2021    9:29 AM  Depression screen PHQ 2/9  Decreased Interest 1 1  Down, Depressed, Hopeless 1 1  PHQ - 2 Score 2 2  Altered sleeping 1 0  Tired, decreased energy 1 1  Change in appetite 0 0  Feeling bad or failure about yourself  0 0  Trouble concentrating 0 0  Moving slowly or fidgety/restless 1 0  Suicidal thoughts 0 0  PHQ-9 Score 5 3        10/04/2021   10:44 AM 06/15/2021    9:31 AM  GAD 7 : Generalized Anxiety Score  Nervous, Anxious, on Edge 1 0  Control/stop worrying 0 0  Worry too much - different things 0 1  Trouble relaxing 0 1  Restless 1 1  Easily annoyed or irritable 1 1  Afraid - awful might happen 0 0  Total GAD 7 Score 3 4      Review of Systems:   Pertinent items are noted in HPI Denies abnormal vaginal discharge w/ itching/odor/irritation, headaches, visual changes, shortness of breath, chest pain, abdominal pain, severe nausea/vomiting, or problems with urination or bowel movements unless otherwise stated above. Pertinent History Reviewed:  Reviewed past medical,surgical, social, obstetrical and family history.  Reviewed problem list, medications and allergies. Physical Assessment:   Vitals:   12/03/21 1453  BP: 135/78  Pulse: 98  Weight: 172 lb 4 oz (78.1 kg)  Body mass index is 27.8 kg/m.        Physical Examination:   General appearance: Well appearing, and in no distress  Mental status: Alert, oriented to person, place, and time  Skin: Warm &  dry  Cardiovascular: Normal heart rate noted  Respiratory: Normal respiratory effort, no distress  Abdomen: Soft, gravid, nontender  Pelvic: Cervical exam deferred         Extremities: Edema: None  Fetal Status: Fetal Heart Rate (bpm): 138 Fundal Height: 35 cm Movement: Present    Chaperone: N/A   No results found for this or any previous visit (from the past 24 hour(s)).  Assessment & Plan:  1) Low-risk pregnancy G1P0 at [redacted]w[redacted]d with an Estimated Date of Delivery: 12/20/21    Meds: No orders of the defined types were placed in this encounter.  Labs/procedures today: none  Plan:  Continue routine obstetrical care  Next visit: prefers in person    Reviewed: Term labor symptoms and general obstetric precautions including but not limited to vaginal bleeding, contractions, leaking of fluid and fetal movement were reviewed in detail with the patient.  All questions were answered. Does have home bp cuff. Office bp cuff given: not applicable. Check bp daily, let us know if consistently >140 and/or >90.  Follow-up: Return for weekly, As scheduled.  Future Appointments  Date Time Provider [place]  12/10/2021 11:50 AM Roma Schanz, CNM [facility]  12/17/2021  2:30 PM Cresenzo-Dishmon, Joaquim Lai, CNM [facility]    No orders of the defined types were placed in this encounter.  Joelene Millin  Mady Gemma CNM, WHNP-BC 12/03/2021 3:15 PM

## 2021-12-03 NOTE — Patient Instructions (Signed)
Jane Duke, thank you for choosing our office today! We appreciate the opportunity to meet your healthcare needs. You may receive a short survey by mail, e-mail, or through MyChart. If you are happy with your care we would appreciate if you could take just a few minutes to complete the survey questions. We read all of your comments and take your feedback very seriously. Thank you again for choosing our office.  [facility]  [facility] ([address] [place], [place] [address]) Entrance C, located off of [address] Free 24/7 valet parking   CLASSES: Go to [email or web address] to register for classes (childbirth, breastfeeding, waterbirth, infant CPR, daddy bootcamp, etc.)  Call the office ([phone]) or go to [facility] if: You begin to have strong, frequent contractions Your water breaks.  Sometimes it is a big gush of fluid, sometimes it is just a trickle that keeps getting your panties wet or running down your legs You have vaginal bleeding.  It is normal to have a small amount of spotting if your cervix was checked.  You don't feel your baby moving like normal.  If you don't, get you something to eat and drink and lay down and focus on feeling your baby move.   If your baby is still not moving like normal, you should call the office or go to [facility].  Call the office ([phone]) or go to [facility] for these signs of pre-eclampsia: Severe headache that does not go away with Tylenol Visual changes- seeing spots, double, blurred vision Pain under your right breast or upper abdomen that does not go away with Tums or heartburn medicine Nausea and/or vomiting Severe swelling in your hands, feet, and face   [place] Pediatric[facility]/Family Doctors [facility] ([facility]): [address], [phone]           [facility]: [address], [phone]                 [facility] ([facility]): [address], [phone] (call to ask if accepting patients) [facility]: [address], [place], [phone]    [place] [facility]/Family Doctors [facility] ([facility]): [address], [phone] [facility]: [address], [phone] [facility]: [address], [phone]  [place] [facility]  [facility] ([facility]): [phone] [facility]: [address], [phone]   [place] [facility]: [address], [phone]  [place] [facility]  [facility]: [address], [phone]  Home Blood Pressure Monitoring for Patients   Your provider has recommended that you check your blood pressure (BP) at least once a week at home. If you do not have a blood pressure cuff at home, one will be provided for you. Contact your provider if you have not received your monitor within 1 week.   Helpful Tips for Accurate Home Blood Pressure Checks  Don't smoke, exercise, or drink caffeine 30 minutes before checking your BP Use the restroom before checking your BP (a full bladder can raise your pressure) Relax in a comfortable upright chair Feet on the ground Left arm resting comfortably on a flat surface at the level of your heart Legs uncrossed Back supported Sit quietly and don't talk Place the cuff on your bare arm Adjust snuggly, so that only two fingertips   can fit between your skin and the top of the cuff Check 2 readings separated by at least one minute Keep a log of your BP readings For a visual, please reference this diagram: http://ccnc.care/bpdiagram  Provider Name: [facility]     Phone: [phone]  Zone 1: ALL CLEAR  Continue to monitor your symptoms:  BP reading is less than 140 (top number) or less than 90 (bottom number)  No right  upper stomach pain No headaches or seeing spots No feeling nauseated or throwing up No swelling in face and hands  Zone 2: CAUTION Call your doctor's office for any of the following:  BP reading is greater than 140 (top number) or greater than 90 (bottom number)  Stomach pain under your ribs in the middle or right side Headaches or seeing spots Feeling nauseated or throwing up Swelling in face and hands  Zone 3: EMERGENCY  Seek immediate medical care if you have any of the following:  BP reading is greater than160 (top number) or greater than 110 (bottom number) Severe headaches not improving with Tylenol Serious difficulty catching your breath Any worsening symptoms from Zone 2   Braxton Hicks Contractions Contractions of the uterus can occur throughout pregnancy, but they are not always a sign that you are in labor. You may have practice contractions called Braxton Hicks contractions. These false labor contractions are sometimes confused with true labor. What are [place] contractions? Braxton Hicks contractions are tightening movements that occur in the muscles of the uterus before labor. Unlike true labor contractions, these contractions do not result in opening (dilation) and thinning of the cervix. Toward the end of pregnancy (32-34 weeks), Braxton Hicks contractions can happen more often and may become stronger. These contractions are sometimes difficult to tell apart from true labor because they can be very uncomfortable. You should not feel embarrassed if you go to the hospital with false labor. Sometimes, the only way to tell if you are in true labor is for your health care provider to look for changes in the cervix. The health care provider will do a physical exam and may monitor your contractions. If you are not in true labor, the exam should show that your cervix is not dilating and your water has not broken. If there are no other health problems associated with your  pregnancy, it is completely safe for you to be sent home with false labor. You may continue to have Braxton Hicks contractions until you go into true labor. How to tell the difference between true labor and false labor True labor Contractions last 30-70 seconds. Contractions become very regular. Discomfort is usually felt in the top of the uterus, and it spreads to the lower abdomen and low back. Contractions do not go away with walking. Contractions usually become more intense and increase in frequency. The cervix dilates and gets thinner. False labor Contractions are usually shorter and not as strong as true labor contractions. Contractions are usually irregular. Contractions are often felt in the front of the lower abdomen and in the groin. Contractions may go away when you walk around or change positions while lying down. Contractions get weaker and are shorter-lasting as time goes on. The cervix usually does not dilate or become thin. Follow these instructions at home:  Take over-the-counter and prescription medicines only as told by your health care provider. Keep up with your usual exercises and follow other instructions from your health care provider. Eat and drink lightly if you think  you are going into labor. If Braxton Hicks contractions are making you uncomfortable: Change your position from lying down or resting to walking, or change from walking to resting. Sit and rest in a tub of warm water. Drink enough fluid to keep your urine pale yellow. Dehydration may cause these contractions. Do slow and deep breathing several times an hour. Keep all follow-up prenatal visits as told by your health care provider. This is important. Contact a health care provider if: You have a fever. You have continuous pain in your abdomen. Get help right away if: Your contractions become stronger, more regular, and closer together. You have fluid leaking or gushing from your vagina. You pass  blood-tinged mucus (bloody show). You have bleeding from your vagina. You have low back pain that you never had before. You feel your baby's head pushing down and causing pelvic pressure. Your baby is not moving inside you as much as it used to. Summary Contractions that occur before labor are called Braxton Hicks contractions, false labor, or practice contractions. Braxton Hicks contractions are usually shorter, weaker, farther apart, and less regular than true labor contractions. True labor contractions usually become progressively stronger and regular, and they become more frequent. Manage discomfort from [facility] contractions by changing position, resting in a warm bath, drinking plenty of water, or practicing deep breathing. This information is not intended to replace advice given to you by your health care provider. Make sure you discuss any questions you have with your health care provider. Document Revised: 01/31/2017 Document Reviewed: 07/04/2016 Elsevier Patient Education  [place].

## 2021-12-10 ENCOUNTER — Ambulatory Visit (INDEPENDENT_AMBULATORY_CARE_PROVIDER_SITE_OTHER): Payer: Medicaid Other | Admitting: Women's Health

## 2021-12-10 ENCOUNTER — Encounter: Payer: Self-pay | Admitting: Women's Health

## 2021-12-10 VITALS — BP 133/80 | HR 80 | Wt 178.0 lb

## 2021-12-10 DIAGNOSIS — Z3403 Encounter for supervision of normal first pregnancy, third trimester: Secondary | ICD-10-CM

## 2021-12-10 DIAGNOSIS — Z3A38 38 weeks gestation of pregnancy: Secondary | ICD-10-CM

## 2021-12-10 NOTE — Patient Instructions (Signed)
Jane Duke, thank you for choosing our office today! We appreciate the opportunity to meet your healthcare needs. You may receive a short survey by mail, e-mail, or through MyChart. If you are happy with your care we would appreciate if you could take just a few minutes to complete the survey questions. We read all of your comments and take your feedback very seriously. Thank you again for choosing our office.  [facility]  [facility] ([address] [place], [place] [address]) Entrance C, located off of [address] Free 24/7 valet parking   CLASSES: Go to [email or web address] to register for classes (childbirth, breastfeeding, waterbirth, infant CPR, daddy bootcamp, etc.)  Call the office ([phone]) or go to [facility] if: You begin to have strong, frequent contractions Your water breaks.  Sometimes it is a big gush of fluid, sometimes it is just a trickle that keeps getting your panties wet or running down your legs You have vaginal bleeding.  It is normal to have a small amount of spotting if your cervix was checked.  You don't feel your baby moving like normal.  If you don't, get you something to eat and drink and lay down and focus on feeling your baby move.   If your baby is still not moving like normal, you should call the office or go to [facility].  Call the office ([phone]) or [facility] for these signs of pre-eclampsia: Severe headache that does not go away with Tylenol Visual changes- seeing spots, double, blurred vision Pain under your right breast or upper abdomen that does not go away with Tums or heartburn medicine Nausea and/or vomiting Severe swelling in your hands, feet, and face   [place] Pediatricians/Family Doctors [facility] ([facility]): [address], [phone]           [facility]: [address], [phone]                 [facility] ([facility]): [address], [phone] (call to ask if accepting patients) [facility]: [address], [place], [phone]    [place] Pediatricians/Family Doctors [facility] ([facility]): [address], [phone] [facility]: [address], [phone] [facility]: [address], [phone]  [place] [facility]  [facility] ([facility]): [phone] [facility]: [address], [phone]   [place] [facility]: [address], [phone]  [place] [facility]  [facility]: [address], [phone]  Home Blood Pressure Monitoring for Patients   Your provider has recommended that you check your blood pressure (BP) at least once a week at home. If you do not have a blood pressure cuff at home, one will be provided for you. Contact your provider if you have not received your monitor within 1 week.   Helpful Tips for Accurate Home Blood Pressure Checks  Don't smoke, exercise, or drink caffeine 30 minutes before checking your BP Use the restroom before checking your BP (a full bladder can raise your pressure) Relax in a comfortable upright chair Feet on the ground Left arm resting comfortably on a flat surface at the level of your heart Legs uncrossed Back supported Sit quietly and don't talk Place the cuff on your bare arm Adjust snuggly, so that only two fingertips   can fit between your skin and the top of the cuff Check 2 readings separated by at least one minute Keep a log of your BP readings For a visual, please reference this diagram: http://ccnc.care/bpdiagram  Provider Name: [facility]     Phone: [phone]  Zone 1: ALL CLEAR  Continue to monitor your symptoms:  BP reading is less than 140 (top number) or less than 90 (bottom number)  No right  upper stomach pain No headaches or seeing spots No feeling nauseated or throwing up No swelling in face and hands  Zone 2: CAUTION Call your doctor's office for any of the following:  BP reading is greater than 140 (top number) or greater than 90 (bottom number)  Stomach pain under your ribs in the middle or right side Headaches or seeing spots Feeling nauseated or throwing up Swelling in face and hands  Zone 3: EMERGENCY  Seek immediate medical care if you have any of the following:  BP reading is greater than160 (top number) or greater than 110 (bottom number) Severe headaches not improving with Tylenol Serious difficulty catching your breath Any worsening symptoms from Zone 2   Braxton Hicks Contractions Contractions of the uterus can occur throughout pregnancy, but they are not always a sign that you are in labor. You may have practice contractions called Braxton Hicks contractions. These false labor contractions are sometimes confused with true labor. What are Jane Duke contractions? Braxton Hicks contractions are tightening movements that occur in the muscles of the uterus before labor. Unlike true labor contractions, these contractions do not result in opening (dilation) and thinning of the cervix. Toward the end of pregnancy (32-34 weeks), Braxton Hicks contractions can happen more often and may become stronger. These contractions are sometimes difficult to tell apart from true labor because they can be very uncomfortable. You should not feel embarrassed if you go to the hospital with false labor. Sometimes, the only way to tell if you are in true labor is for your health care provider to look for changes in the cervix. The health care provider will do a physical exam and may monitor your contractions. If you are not in true labor, the exam should show that your cervix is not dilating and your water has not broken. If there are no other health problems associated with your  pregnancy, it is completely safe for you to be sent home with false labor. You may continue to have Braxton Hicks contractions until you go into true labor. How to tell the difference between true labor and false labor True labor Contractions last 30-70 seconds. Contractions become very regular. Discomfort is usually felt in the top of the uterus, and it spreads to the lower abdomen and low back. Contractions do not go away with walking. Contractions usually become more intense and increase in frequency. The cervix dilates and gets thinner. False labor Contractions are usually shorter and not as strong as true labor contractions. Contractions are usually irregular. Contractions are often felt in the front of the lower abdomen and in the groin. Contractions may go away when you walk around or change positions while lying down. Contractions get weaker and are shorter-lasting as time goes on. The cervix usually does not dilate or become thin. Follow these instructions at home:  Take over-the-counter and prescription medicines only as told by your health care provider. Keep up with your usual exercises and follow other instructions from your health care provider. Eat and drink lightly if you think  you are going into labor. If Braxton Hicks contractions are making you uncomfortable: Change your position from lying down or resting to walking, or change from walking to resting. Sit and rest in a tub of warm water. Drink enough fluid to keep your urine pale yellow. Dehydration may cause these contractions. Do slow and deep breathing several times an hour. Keep all follow-up prenatal visits as told by your health care provider. This is important. Contact a health care provider if: You have a fever. You have continuous pain in your abdomen. Get help right away if: Your contractions become stronger, more regular, and closer together. You have fluid leaking or gushing from your vagina. You pass  blood-tinged mucus (bloody show). You have bleeding from your vagina. You have low back pain that you never had before. You feel your baby's head pushing down and causing pelvic pressure. Your baby is not moving inside you as much as it used to. Summary Contractions that occur before labor are called Braxton Hicks contractions, false labor, or practice contractions. Braxton Hicks contractions are usually shorter, weaker, farther apart, and less regular than true labor contractions. True labor contractions usually become progressively stronger and regular, and they become more frequent. Manage discomfort from [facility] contractions by changing position, resting in a warm bath, drinking plenty of water, or practicing deep breathing. This information is not intended to replace advice given to you by your health care provider. Make sure you discuss any questions you have with your health care provider. Document Revised: 01/31/2017 Document Reviewed: 07/04/2016 Elsevier Patient Education  [place].

## 2021-12-10 NOTE — Progress Notes (Signed)
    LOW-RISK PREGNANCY VISIT Patient name: Jane Duke MRN [id number]  Date of birth: September 11, 2000 Chief Complaint:   Routine Prenatal Visit  History of Present Illness:   Jane Duke is a 21 y.o. G1P0 female at [redacted]w[redacted]d with an Estimated Date of Delivery: 12/20/21 being seen today for ongoing management of a low-risk pregnancy.   Today she reports no complaints. Contractions: Not present.  .  Movement: Present. denies leaking of fluid.     10/04/2021   10:43 AM 06/15/2021    9:29 AM  Depression screen PHQ 2/9  Decreased Interest 1 1  Down, Depressed, Hopeless 1 1  PHQ - 2 Score 2 2  Altered sleeping 1 0  Tired, decreased energy 1 1  Change in appetite 0 0  Feeling bad or failure about yourself  0 0  Trouble concentrating 0 0  Moving slowly or fidgety/restless 1 0  Suicidal thoughts 0 0  PHQ-9 Score 5 3        10/04/2021   10:44 AM 06/15/2021    9:31 AM  GAD 7 : Generalized Anxiety Score  Nervous, Anxious, on Edge 1 0  Control/stop worrying 0 0  Worry too much - different things 0 1  Trouble relaxing 0 1  Restless 1 1  Easily annoyed or irritable 1 1  Afraid - awful might happen 0 0  Total GAD 7 Score 3 4      Review of Systems:   Pertinent items are noted in HPI Denies abnormal vaginal discharge w/ itching/odor/irritation, headaches, visual changes, shortness of breath, chest pain, abdominal pain, severe nausea/vomiting, or problems with urination or bowel movements unless otherwise stated above. Pertinent History Reviewed:  Reviewed past medical,surgical, social, obstetrical and family history.  Reviewed problem list, medications and allergies. Physical Assessment:   Vitals:   12/10/21 1205  BP: 133/80  Pulse: 80  Weight: 178 lb (80.7 kg)  Body mass index is 28.73 kg/m.        Physical Examination:   General appearance: Well appearing, and in no distress  Mental status: Alert, oriented to person, place, and time  Skin: Warm & dry  Cardiovascular: Normal  heart rate noted  Respiratory: Normal respiratory effort, no distress  Abdomen: Soft, gravid, nontender  Pelvic: Cervical exam performed  Dilation: Closed Effacement (%): Thick Station: Ballotable  Extremities: Edema: None  Fetal Status: Fetal Heart Rate (bpm): 144 Fundal Height: 36 cm Movement: Present Presentation: Vertex  Chaperone: Peggy Dones   No results found for this or any previous visit (from the past 24 hour(s)).  Assessment & Plan:  1) Low-risk pregnancy G1P0 at [redacted]w[redacted]d with an Estimated Date of Delivery: 12/20/21    Meds: No orders of the defined types were placed in this encounter.  Labs/procedures today: SVE  Plan:  Continue routine obstetrical care  Next visit: prefers in person    Reviewed: Term labor symptoms and general obstetric precautions including but not limited to vaginal bleeding, contractions, leaking of fluid and fetal movement were reviewed in detail with the patient.  All questions were answered. Does have home bp cuff. Office bp cuff given: not applicable. Check bp weekly, let us know if consistently >140 and/or >90.  Follow-up: Return for weekly, As scheduled.  Future Appointments  Date Time Provider Downs  12/17/2021  2:30 PM Cresenzo-Dishmon, Joaquim Lai, CNM [facility]    No orders of the defined types were placed in this encounter.  [place], [facility] 12/10/2021 12:22 PM

## 2021-12-17 ENCOUNTER — Ambulatory Visit (INDEPENDENT_AMBULATORY_CARE_PROVIDER_SITE_OTHER): Payer: Medicaid Other | Admitting: Advanced Practice Midwife

## 2021-12-17 ENCOUNTER — Encounter: Payer: Self-pay | Admitting: Advanced Practice Midwife

## 2021-12-17 VITALS — BP 133/84 | HR 84 | Wt 179.0 lb

## 2021-12-17 DIAGNOSIS — Z1332 Encounter for screening for maternal depression: Secondary | ICD-10-CM | POA: Diagnosis not present

## 2021-12-17 DIAGNOSIS — Z3403 Encounter for supervision of normal first pregnancy, third trimester: Secondary | ICD-10-CM | POA: Diagnosis not present

## 2021-12-17 DIAGNOSIS — Z3A39 39 weeks gestation of pregnancy: Secondary | ICD-10-CM

## 2021-12-17 NOTE — Patient Instructions (Addendum)
AM I IN LABOR? What is labor? Labor is the work that your body does to birth your baby. Your uterus (the womb) contracts. Your cervix (the mouth of the uterus) opens. You will push your baby out into the world.  What do contractions (labor pains) feel like? When they first start, contractions usually feel like cramps during your period. Sometimes you feel pain in your back. Most often, contractions feel like muscles pulling painfully in your lower belly. At first, the contractions will probably be 15 to 20 minutes apart. They will not feel too painful. As labor goes on, the contractions get stronger, closer together, and more painful.  How do I time the contractions? Time your contractions by counting the number of minutes from the start of one contraction to the start of the next contraction.  What should I do when the contractions start? If it is night and you can sleep, sleep. If it happens during the day, here are some things you can do to take care of yourself at home: ? Walk. If the pains you are having are real labor, walking will make the contractions come faster and harder. If the contractions are not going to continue and be real labor, walking will make the contractions slow down. ? Take a shower or bath. This will help you relax. ? Eat. Labor is a big event. It takes a lot of energy. ? Drink water. Not drinking enough water can cause false labor (contractions that hurt but do not open your cervix). If this is true labor, drinking water will help you have strength to get through your labor. ? Take a nap. Get all the rest you can. ? Get a massage. If your labor is in your back, a strong massage on your lower back may feel very good. Getting a foot massage is always good. ? Don't panic. You can do this. Your body was made for this. You are strong!  When should I go to the hospital or call my health care provider? ? Your contractions have been 5 minutes apart or less for at least 1  hour. ? If several contractions are so painful you cannot walk or talk during one. ? Your bag of waters breaks. (You may have a big gush of water or just water that runs down your legs when you walk.)  Are there other reasons to call my health care provider? Yes, you should call your health care provider or go to the hospital if you start to bleed like you are having a period-- blood that soaks your underwear or runs down your legs, if you have sudden severe pain, if your baby has not moved for several hours, or if you are leaking green fluid. The rule is as follows: If you are very concerned about something, call.   Cervical Ripening (to get your cervix ready for labor) : May try one or all:  Red Raspberry Leaf capsules:  two 300mg or 400mg tablets with each meal, 2-3 times a day  Potential Side Effects Of Raspberry Leaf:  Most women do not experience any side effects from drinking raspberry leaf tea. However, nausea and loose stools are possible     Evening Primrose Oil capsules: may take 1 to 3 capsules daily. May also prick one to release the oil and insert it into your vagina at night.  Some of the potential side effects:  Upset stomach  Loose stools or diarrhea  Headaches  Nausea   6 Dates a   day (may taste better if warmed in microwave until soft). Found where raisins are in the grocery store  

## 2021-12-17 NOTE — Progress Notes (Signed)
   LOW-RISK PREGNANCY VISIT Patient name: Jane Duke MRN [id number]  Date of birth: [birth year] Chief Complaint:   Routine Prenatal Visit  History of Present Illness:   Jane Duke is a 21 y.o. G1P0 female at [redacted]w[redacted]d with an Estimated Date of Delivery: 12/20/21 being seen today for ongoing management of a low-risk pregnancy.  Today she reports no complaints. Contractions: Irritability. Vag. Bleeding: None.  Movement: Present. denies leaking of fluid. Review of Systems:   Pertinent items are noted in HPI Denies abnormal vaginal discharge w/ itching/odor/irritation, headaches, visual changes, shortness of breath, chest pain, abdominal pain, severe nausea/vomiting, or problems with urination or bowel movements unless otherwise stated above. Pertinent History Reviewed:  Reviewed past medical,surgical, social, obstetrical and family history.  Reviewed problem list, medications and allergies. Physical Assessment:   Vitals:   12/17/21 1451  BP: 133/84  Pulse: 84  Weight: 179 lb (81.2 kg)  Body mass index is 28.89 kg/m.        Physical Examination:   General appearance: Well appearing, and in no distress  Mental status: Alert, oriented to person, place, and time  Skin: Warm & dry  Cardiovascular: Normal heart rate noted  Respiratory: Normal respiratory effort, no distress  Abdomen: Soft, gravid, nontender  Pelvic: Cervical exam performed  Dilation: Fingertip Effacement (%): Thick Station: -2  Extremities: Edema: None  Fetal Status:     Movement: Present Presentation: Vertex  Chaperone:  callandra RN    No results found for this or any previous visit (from the past 24 hour(s)).  Assessment & Plan:  1) Low-risk pregnancy G1P0 at [redacted]w[redacted]d with an Estimated Date of Delivery: 12/20/21      Meds: No orders of the defined types were placed in this encounter.  Labs/procedures today: GAD;3/PhQ9:  3  Plan:  Continue routine obstetrical care  Next visit: prefers in person    Reviewed:  Term labor symptoms and general obstetric precautions including but not limited to vaginal bleeding, contractions, leaking of fluid and fetal movement were reviewed in detail with the patient.  All questions were answered. Has home bp cuff. . Check bp weekly, let us know if >140/90.   Follow-up: Return in about 1 week (around 12/24/2021) for LROB and NST.  Future Appointments  Date Time Provider [place]  12/25/2021  1:30 PM Roma Schanz, CNM [facility]  12/25/2021  1:50 PM [facility] NURSE [facility]    No orders of the defined types were placed in this encounter.  Christin Fudge DNP, CNM 12/17/2021 3:22 PM

## 2021-12-20 ENCOUNTER — Encounter (HOSPITAL_COMMUNITY): Payer: Self-pay | Admitting: Obstetrics & Gynecology

## 2021-12-20 ENCOUNTER — Other Ambulatory Visit: Payer: Self-pay

## 2021-12-20 ENCOUNTER — Inpatient Hospital Stay (HOSPITAL_COMMUNITY)
Admission: AD | Admit: 2021-12-20 | Discharge: 2021-12-22 | DRG: 807 | Disposition: A | Discharge status: Home / Self Care | Payer: Medicaid Other | Attending: Obstetrics & Gynecology | Admitting: Obstetrics & Gynecology

## 2021-12-20 DIAGNOSIS — O479 False labor, unspecified: Secondary | ICD-10-CM | POA: Diagnosis present

## 2021-12-20 DIAGNOSIS — O4292 Full-term premature rupture of membranes, unspecified as to length of time between rupture and onset of labor: Principal | ICD-10-CM | POA: Diagnosis present

## 2021-12-20 DIAGNOSIS — Z3400 Encounter for supervision of normal first pregnancy, unspecified trimester: Secondary | ICD-10-CM

## 2021-12-20 DIAGNOSIS — O134 Gestational [pregnancy-induced] hypertension without significant proteinuria, complicating childbirth: Secondary | ICD-10-CM | POA: Diagnosis present

## 2021-12-20 DIAGNOSIS — Z3A40 40 weeks gestation of pregnancy: Secondary | ICD-10-CM

## 2021-12-20 DIAGNOSIS — Z3403 Encounter for supervision of normal first pregnancy, third trimester: Principal | ICD-10-CM

## 2021-12-20 DIAGNOSIS — O9902 Anemia complicating childbirth: Secondary | ICD-10-CM | POA: Diagnosis present

## 2021-12-20 DIAGNOSIS — O139 Gestational [pregnancy-induced] hypertension without significant proteinuria, unspecified trimester: Secondary | ICD-10-CM | POA: Insufficient documentation

## 2021-12-20 LAB — COMPREHENSIVE METABOLIC PANEL
ALT: 10 U/L (ref 0–44)
AST: 20 U/L (ref 15–41)
Albumin: 3.1 g/dL — ABNORMAL LOW (ref 3.5–5.0)
Alkaline Phosphatase: 132 U/L — ABNORMAL HIGH (ref 38–126)
Anion gap: 14 (ref 5–15)
BUN: <5 mg/dL — ABNORMAL LOW (ref 6–20)
CO2: 21 mmol/L — ABNORMAL LOW (ref 22–32)
Calcium: 9.1 mg/dL (ref 8.9–10.3)
Chloride: 104 mmol/L (ref 98–111)
Creatinine, Ser: 0.63 mg/dL (ref 0.44–1.00)
GFR, Estimated: >60 mL/min (ref >60)
Glucose, Bld: 81 mg/dL (ref 70–99)
Potassium: 3.2 mmol/L — ABNORMAL LOW (ref 3.5–5.1)
Sodium: 139 mmol/L (ref 135–145)
Total Bilirubin: 0.6 mg/dL (ref 0.3–1.2)
Total Protein: 6.1 g/dL — ABNORMAL LOW (ref 6.5–8.1)

## 2021-12-20 LAB — TYPE AND SCREEN
ABO/RH(D): O POS
Antibody Screen: NEGATIVE

## 2021-12-20 LAB — CBC
HCT: 29.6 % — ABNORMAL LOW (ref 36.0–46.0)
Hemoglobin: 9.6 g/dL — ABNORMAL LOW (ref 12.0–15.0)
MCH: 28.8 pg (ref 26.0–34.0)
MCHC: 32.4 g/dL (ref 30.0–36.0)
MCV: 88.9 fL (ref 80.0–100.0)
Platelets: 235 10*3/uL (ref 150–400)
RBC: 3.33 MIL/uL — ABNORMAL LOW (ref 3.87–5.11)
RDW: 13.8 % (ref 11.5–15.5)
WBC: 13.3 10*3/uL — ABNORMAL HIGH (ref 4.0–10.5)
nRBC: 0 % (ref 0.0–0.2)

## 2021-12-20 LAB — POCT FERN TEST: POCT Fern Test: POSITIVE

## 2021-12-20 MED ORDER — ONDANSETRON HCL 4 MG PO TABS: 4.0000 mg | ORAL_TABLET | ORAL | Status: DC | PRN

## 2021-12-20 MED ORDER — OXYTOCIN-SODIUM CHLORIDE 30-0.9 UT/500ML-% IV SOLN
1.0000 m[IU]/min | INTRAVENOUS | Status: DC
  Administered 2021-12-20: 2 m[IU]/min via INTRAVENOUS
  Filled 2021-12-20: qty 500

## 2021-12-20 MED ORDER — ONDANSETRON HCL 4 MG/2ML IJ SOLN: 4.0000 mg | INTRAMUSCULAR | Status: DC | PRN

## 2021-12-20 MED ORDER — OXYTOCIN BOLUS FROM INFUSION
333.0000 mL | Freq: Once | INTRAVENOUS | Status: AC
  Administered 2021-12-20: 333 mL via INTRAVENOUS

## 2021-12-20 MED ORDER — LIDOCAINE HCL (PF) 1 % IJ SOLN
30.0000 mL | INTRAMUSCULAR | Status: AC | PRN
  Administered 2021-12-20: 30 mL via SUBCUTANEOUS
  Filled 2021-12-20: qty 30

## 2021-12-20 MED ORDER — EPHEDRINE 5 MG/ML INJ: 10.0000 mg | INTRAVENOUS | Status: DC | PRN

## 2021-12-20 MED ORDER — OXYCODONE-ACETAMINOPHEN 5-325 MG PO TABS: 1.0000 | ORAL_TABLET | ORAL | Status: DC | PRN

## 2021-12-20 MED ORDER — OXYCODONE-ACETAMINOPHEN 5-325 MG PO TABS: 2.0000 | ORAL_TABLET | ORAL | Status: DC | PRN

## 2021-12-20 MED ORDER — MISOPROSTOL 50MCG HALF TABLET
50.0000 ug | ORAL_TABLET | Freq: Once | ORAL | Status: AC
  Administered 2021-12-20: 50 ug via ORAL
  Filled 2021-12-20 (×2): qty 1

## 2021-12-20 MED ORDER — OXYTOCIN-SODIUM CHLORIDE 30-0.9 UT/500ML-% IV SOLN: 2.5000 [IU]/h | INTRAVENOUS | Status: DC

## 2021-12-20 MED ORDER — PRENATAL MULTIVITAMIN CH
1.0000 | ORAL_TABLET | Freq: Every day | ORAL | Status: DC
  Administered 2021-12-21 – 2021-12-22 (×2): 1 via ORAL
  Filled 2021-12-20 (×2): qty 1

## 2021-12-20 MED ORDER — SODIUM CHLORIDE 0.9% FLUSH
3.0000 mL | Freq: Two times a day (BID) | INTRAVENOUS | Status: DC
  Administered 2021-12-20: 3 mL via INTRAVENOUS

## 2021-12-20 MED ORDER — DIBUCAINE (PERIANAL) 1 % EX OINT: 1.0000 | TOPICAL_OINTMENT | CUTANEOUS | Status: DC | PRN

## 2021-12-20 MED ORDER — LACTATED RINGERS IV SOLN: INTRAVENOUS | Status: DC

## 2021-12-20 MED ORDER — PHENYLEPHRINE 80 MCG/ML (10ML) SYRINGE FOR IV PUSH (FOR BLOOD PRESSURE SUPPORT): 80.0000 ug | PREFILLED_SYRINGE | INTRAVENOUS | Status: DC | PRN

## 2021-12-20 MED ORDER — MISOPROSTOL 50MCG HALF TABLET: 50.0000 ug | ORAL_TABLET | Freq: Once | ORAL | Status: DC

## 2021-12-20 MED ORDER — LACTATED RINGERS IV SOLN: 500.0000 mL | Freq: Once | INTRAVENOUS | Status: DC

## 2021-12-20 MED ORDER — TERBUTALINE SULFATE 1 MG/ML IJ SOLN: 0.2500 mg | Freq: Once | INTRAMUSCULAR | Status: DC | PRN

## 2021-12-20 MED ORDER — SOD CITRATE-CITRIC ACID 500-334 MG/5ML PO SOLN: 30.0000 mL | ORAL | Status: DC | PRN

## 2021-12-20 MED ORDER — ACETAMINOPHEN 325 MG PO TABS: 650.0000 mg | ORAL_TABLET | ORAL | Status: DC | PRN

## 2021-12-20 MED ORDER — BENZOCAINE-MENTHOL 20-0.5 % EX AERO
1.0000 | INHALATION_SPRAY | CUTANEOUS | Status: DC | PRN
  Administered 2021-12-20: 1 via TOPICAL
  Filled 2021-12-20: qty 56

## 2021-12-20 MED ORDER — DIPHENHYDRAMINE HCL 25 MG PO CAPS: 25.0000 mg | ORAL_CAPSULE | Freq: Four times a day (QID) | ORAL | Status: DC | PRN

## 2021-12-20 MED ORDER — LACTATED RINGERS IV SOLN: 500.0000 mL | INTRAVENOUS | Status: DC | PRN

## 2021-12-20 MED ORDER — SODIUM CHLORIDE 0.9% FLUSH: 3.0000 mL | INTRAVENOUS | Status: DC | PRN

## 2021-12-20 MED ORDER — SIMETHICONE 80 MG PO CHEW: 80.0000 mg | CHEWABLE_TABLET | ORAL | Status: DC | PRN

## 2021-12-20 MED ORDER — FENTANYL CITRATE (PF) 100 MCG/2ML IJ SOLN
50.0000 ug | INTRAMUSCULAR | Status: DC | PRN
  Administered 2021-12-20: 100 ug via INTRAVENOUS
  Filled 2021-12-20 (×2): qty 2

## 2021-12-20 MED ORDER — ZOLPIDEM TARTRATE 5 MG PO TABS: 5.0000 mg | ORAL_TABLET | Freq: Every evening | ORAL | Status: DC | PRN

## 2021-12-20 MED ORDER — FENTANYL-BUPIVACAINE-NACL 0.5-0.125-0.9 MG/250ML-% EP SOLN: 12.0000 mL/h | EPIDURAL | Status: DC | PRN

## 2021-12-20 MED ORDER — COCONUT OIL OIL: 1.0000 | TOPICAL_OIL | Status: DC | PRN

## 2021-12-20 MED ORDER — DIPHENHYDRAMINE HCL 50 MG/ML IJ SOLN: 12.5000 mg | INTRAMUSCULAR | Status: DC | PRN

## 2021-12-20 MED ORDER — IBUPROFEN 600 MG PO TABS
600.0000 mg | ORAL_TABLET | Freq: Four times a day (QID) | ORAL | Status: DC
  Administered 2021-12-20 – 2021-12-22 (×6): 600 mg via ORAL
  Filled 2021-12-20 (×6): qty 1

## 2021-12-20 MED ORDER — MISOPROSTOL 25 MCG QUARTER TABLET
25.0000 ug | ORAL_TABLET | Freq: Once | ORAL | Status: AC
  Administered 2021-12-20: 25 ug via VAGINAL
  Filled 2021-12-20: qty 1

## 2021-12-20 MED ORDER — SENNOSIDES-DOCUSATE SODIUM 8.6-50 MG PO TABS
2.0000 | ORAL_TABLET | ORAL | Status: DC
  Administered 2021-12-21: 2 via ORAL
  Filled 2021-12-20 (×2): qty 2

## 2021-12-20 MED ORDER — ONDANSETRON HCL 4 MG/2ML IJ SOLN: 4.0000 mg | Freq: Four times a day (QID) | INTRAMUSCULAR | Status: DC | PRN

## 2021-12-20 MED ORDER — SODIUM CHLORIDE 0.9 % IV SOLN: 250.0000 mL | INTRAVENOUS | Status: DC | PRN

## 2021-12-20 MED ORDER — WITCH HAZEL-GLYCERIN EX PADS: 1.0000 | MEDICATED_PAD | CUTANEOUS | Status: DC | PRN

## 2021-12-20 NOTE — Discharge Summary (Signed)
Postpartum Discharge Summary  Date of Service updated-10/21     Patient Name: Jane Duke DOB: 04-23-2000 MRN: [id number]  Date of admission: 12/20/2021 Delivery date:12/20/2021  Delivering provider: Shelda Pal  Date of discharge: 12/22/2021  Admitting diagnosis: Uterine contractions at greater than 20 weeks of gestation [O47.9] Intrauterine pregnancy: [redacted]w[redacted]d    Secondary diagnosis:  Principal Problem:   Vaginal delivery Active Problems:   Supervision of normal first pregnancy   Uterine contractions at greater than 20 weeks of gestation   Gestational hypertension  Additional problems: none    Discharge diagnosis: Term Pregnancy Delivered                                              Post partum procedures: none Augmentation: Pitocin, Cytotec, and IP Foley Complications: None  Hospital course: Onset of Labor With Vaginal Delivery      21y.o. yo G1P0 at 428w0das admitted in Latent Labor on 12/20/2021. Labor course was complicated by none  Membrane Rupture Time/Date: 7:30 AM ,12/20/2021   Delivery Method:Vaginal, Spontaneous  Episiotomy: None  Lacerations:  1st degree  Due to gestational HTN she was treated with procardia and Lasix, BP remained stable. She is ambulating, tolerating a regular diet, passing flatus, and urinating well. Patient is discharged home in stable condition on 12/22/21.  Newborn Data: Birth date:12/20/2021  Birth time:8:34 PM  Gender:Female  Living status:Living  Apgars:6 ,9  Weight:3374 g   Magnesium Sulfate received: No BMZ received: No Rhophylac:N/A MMR:N/A T-DaP:Given prenatally Flu: No Transfusion:No  Physical exam  Vitals:   12/21/21 0801 12/21/21 1215 12/21/21 2042 12/22/21 0540  BP: 127/74 126/79 128/85 124/72  Pulse: 80 78 82 78  Resp: _0 Temp: 98 F (36.7 C) 99 F (37.2 C) 98.4 F (36.9 C) 97.9 F (36.6 C)  TempSrc: Oral Oral Oral Oral  SpO2: 98% 99% 100% 99%  Weight:      Height:        General: alert, cooperative, and no distress Lochia: appropriate Uterine Fundus: firm Incision: N/A DVT Evaluation: No evidence of DVT seen on physical exam. Labs: Lab Results  Component Value Date   WBC 20.6 (H) 12/21/2021   HGB 9.0 (L) 12/21/2021   HCT 26.3 (L) 12/21/2021   MCV 85.9 12/21/2021   PLT 233 12/21/2021      Latest Ref Rng & Units 12/20/2021    7:48 PM  CMP  Glucose 70 - 99 mg/dL 81   BUN 6 - 20 mg/dL <5   Creatinine 0.44 - 1.00 mg/dL 0.63   Sodium 135 - 145 mmol/L 139   Potassium 3.5 - 5.1 mmol/L 3.2   Chloride 98 - 111 mmol/L 104   CO2 22 - 32 mmol/L 21   Calcium 8.9 - 10.3 mg/dL 9.1   Total Protein 6.5 - 8.1 g/dL 6.1   Total Bilirubin 0.3 - 1.2 mg/dL 0.6   Alkaline Phos 38 - 126 U/L 132   AST 15 - 41 U/L 20   ALT 0 - 44 U/L 10    Edinburgh Score:    12/21/2021    9:08 AM  Edinburgh Postnatal Depression Scale Screening Tool  I have been able to laugh and see the funny side of things. 0  I have looked forward with enjoyment to things. 0  I have blamed myself unnecessarily  when things went wrong. 2  I have been anxious or worried for no good reason. 0  I have felt scared or panicky for no good reason. 0  Things have been getting on top of me. 1  I have been so unhappy that I have had difficulty sleeping. 1  I have felt sad or miserable. 0  I have been so unhappy that I have been crying. 0  The thought of harming myself has occurred to me. 0  Edinburgh Postnatal Depression Scale Total 4     After visit meds:  Allergies as of 12/22/2021   No Known Allergies      Medication List     TAKE these medications    acetaminophen 325 MG tablet Commonly known as: TYLENOL Take 325 mg by mouth 2 (two) times daily as needed for headache.   Blood Pressure Monitor Misc For regular home bp monitoring during pregnancy   furosemide 20 MG tablet Commonly known as: LASIX Take 1 tablet (20 mg total) by mouth daily.   ibuprofen 600 MG tablet Commonly  known as: ADVIL Take 1 tablet (600 mg total) by mouth every 6 (six) hours.   IRON PO Take 1 tablet by mouth daily.   NIFEdipine 30 MG 24 hr tablet Commonly known as: ADALAT CC Take 1 tablet (30 mg total) by mouth daily.   PRENATAL VITAMIN PO Take 1 tablet by mouth daily.         Discharge home in stable condition Infant Feeding: Bottle and Breast Infant Disposition:home with mother Discharge instruction: per After Visit Summary and Postpartum booklet. Activity: Advance as tolerated. Pelvic rest for 6 weeks.  Diet: routine diet Future Appointments: Future Appointments  Date Time Provider [place]  12/27/2021 10:10 AM [facility] NURSE [facility]  01/31/2022 10:30 AM Cresenzo-Dishmon, Joaquim Lai, CNM [facility]   Follow up Visit:  Follow-up Information     [facility] Follow up in 1 week(s).   Specialty: Obstetrics and Gynecology Why: Please follow up in 1wk for a BP check Contact information: [place] [phone]               Message sent to [facility] on 10/19  Please schedule this patient for a In person postpartum visit in 6 weeks with the following provider: Any provider. Additional Postpartum F/U:BP check 1 week  Low risk pregnancy complicated by: HTN Delivery mode:  Vaginal, Spontaneous  Anticipated Birth Control:  Unsure   12/22/2021 Annalee Genta, DO

## 2021-12-20 NOTE — H&P (Addendum)
OBSTETRIC ADMISSION HISTORY AND PHYSICAL  Jaydeen Victor is a 21 y.o. female G1P0 with IUP at [redacted]w[redacted]d by boyfriend presenting for SROM. She reports +FMs, No LOF, no VB, no blurry vision, headaches or peripheral edema, and RUQ pain.  She plans on breast and bottle feeding. She request pills for birth control. She received her [facility]   Dating: By Octaviano Glow, RN --->  Estimated Date of Delivery: 12/20/21  Sono:    @[redacted]w[redacted]d , CWD, normal anatomy, breech presentation, 287g, 56% EFW   Prenatal History/Complications: None  Past Medical History: Past Medical History:  Diagnosis Date   Medical history non-contributory     Past Surgical History: Past Surgical History:  Procedure Laterality Date   NO PAST SURGERIES      Obstetrical History: OB History     Gravida  1   Para      Term      Preterm      AB      Living         SAB      IAB      Ectopic      Multiple      Live Births              Social History Social History   Socioeconomic History   Marital status: Single    Spouse name: Not on file   Number of children: Not on file   Years of education: Not on file   Highest education level: Not on file  Occupational History   Not on file  Tobacco Use   Smoking status: Never   Smokeless tobacco: Never  Vaping Use   Vaping Use: Never used  Substance and Sexual Activity   Alcohol use: Never   Drug use: Not Currently    Types: Marijuana    Comment: "not everyday"   Sexual activity: Yes    Birth control/protection: None  Other Topics Concern   Not on file  Social History Narrative   Not on file   Social Determinants of Health   Financial Resource Strain: Low Risk  (10/04/2021)   Overall Financial Resource Strain (CARDIA)    Difficulty of Paying Living Expenses: Not very hard  Food Insecurity: No Food Insecurity (12/20/2021)   Hunger Vital Sign    Worried About Running Out of Food in the Last Year: Never true    Ran  Out of Food in the Last Year: Never true  Transportation Needs: No Transportation Needs (12/20/2021)   PRAPARE - Hydrologist (Medical): No    Lack of Transportation (Non-Medical): No  Physical Activity: Insufficiently Active (10/04/2021)   Exercise Vital Sign    Days of Exercise per Week: 3 days    Minutes of Exercise per Session: 10 min  Stress: No Stress Concern Present (10/04/2021)   [place]    Feeling of Stress : Only a little  Social Connections: Moderately Integrated (10/04/2021)   Social Connection and Isolation Panel [NHANES]    Frequency of Communication with Friends and Family: Three times a week    Frequency of Social Gatherings with Friends and Family: Three times a week    Attends Religious Services: 1 to 4 times per year    Active Member of Clubs or Organizations: No    Attends Archivist Meetings: Never    Marital Status: Living with partner    Family History: Family  History  Problem Relation Age of Onset   Hypertension Mother    Hypertension Maternal Grandmother    Kidney failure Maternal Grandmother     Allergies: No Known Allergies  Medications Prior to Admission  Medication Sig Dispense Refill Last Dose   Ferrous Sulfate (IRON PO) Take by mouth.   12/19/2021   Prenatal Vit-Fe Fumarate-FA (PRENATAL VITAMIN PO) Take by mouth.   12/19/2021   Blood Pressure Monitor MISC For regular home bp monitoring during pregnancy 1 each 0      Review of Systems   All systems reviewed and negative except as stated in HPI  Blood pressure (!) 140/84, pulse 72, temperature 98.2 F (36.8 C), temperature source Oral, resp. rate 19, height 5\' 5"  (1.651 m), weight 81.6 kg, last menstrual period 03/24/2021, SpO2 99 %. General appearance: alert, cooperative, and appears stated age Lungs: clear to auscultation bilaterally Heart: regular rate and rhythm Abdomen: soft,  non-tender; bowel sounds normal Cervical exam: 1.5 cm, 60%, -2 Extremities: Homans sign is negative, no sign of DVT Presentation: breech Fetal monitoring Baseline: 140 bpm and Variability: Good {> 6 bpm) Uterine activity: None Dilation: 4 Effacement (%): 70 Station: -1 Exam by:: Dr. Clement Husbands   Prenatal labs: ABO, Rh: --/--/O POS (10/19 1045) Antibody: NEG (10/19 1045) Rubella: 7.11 (04/14 1007) RPR: Non Reactive (08/03 0924)  HBsAg: Negative (04/14 1007)  HIV: Non Reactive (08/03 0924)  GBS: Negative/-- (09/25 1625)  1 hr Glucola: pending  Genetic screening:  normal Anatomy US:  Normal fetal sonographic findings, specifically normal complete anatomical evaluation,  no abnormalities noted   Prenatal Transfer Tool  Maternal Diabetes: No Genetic Screening: Normal Maternal Ultrasounds/Referrals: Normal Fetal Ultrasounds or other Referrals:  None Maternal Substance Abuse:  No Significant Maternal Medications:  None Significant Maternal Lab Results:  Group B Strep negative Number of Prenatal Visits:greater than 3 verified prenatal visits Other Comments:  None  Results for orders placed or performed during the hospital encounter of 12/20/21 (from the past 24 hour(s))  Fern Test   Collection Time: 12/20/21 10:15 AM  Result Value Ref Range   POCT Fern Test Positive = ruptured amniotic membanes   CBC   Collection Time: 12/20/21 10:45 AM  Result Value Ref Range   WBC 13.3 (H) 4.0 - 10.5 K/uL   RBC 3.33 (L) 3.87 - 5.11 MIL/uL   Hemoglobin 9.6 (L) 12.0 - 15.0 g/dL   HCT 29.6 (L) 36.0 - 46.0 %   MCV 88.9 80.0 - 100.0 fL   MCH 28.8 26.0 - 34.0 pg   MCHC 32.4 30.0 - 36.0 g/dL   RDW 13.8 11.5 - 15.5 %   Platelets 235 150 - 400 K/uL   nRBC 0.0 0.0 - 0.2 %  Type and screen [place]   Collection Time: 12/20/21 10:45 AM  Result Value Ref Range   ABO/RH(D) O POS    Antibody Screen NEG    Sample Expiration      12/23/2021,2359 Performed at [facility], [place] [address], [place], [place] [address]     Patient Active Problem List   Diagnosis Date Noted   Uterine contractions at greater than 20 weeks of gestation 12/20/2021   Supervision of normal first pregnancy 06/13/2021    Assessment/Plan:  Deshonda Bown is a 21 y.o.  at [redacted]w[redacted]d here for PROM at 70.              G1P0 SIUP  gHTN             Cat 1 FHR             GBS Negative/-- (9/05 1625)  Plan:      Admit to L&D             IV pain meds/epidural prn active labor             Pre-e labs pending             Cytotec 50 PO 25 Vag, FB inserted 1230             Anticipate NSVB                      Plans to breastfeed with formula supp.             Contraception: pills              Shelda Pal, DO  12/20/2021, 7:16 PM

## 2021-12-20 NOTE — Progress Notes (Signed)
Labor Progress Note Aneeka Bowden is a 21 y.o. G1P0 at [redacted]w[redacted]d presented for SOL S: Not feeling contractions yet.    O:  BP 135/83   Pulse 73   Temp 98.2 F (36.8 C) (Oral)   Resp 19   Ht 5\' 5"  (1.651 m)   Wt 81.6 kg   LMP 03/24/2021   SpO2 99%   BMI 29.94 kg/m  EFM: 140bpm/Moderate variability/ 15x15 accels/ None decels   CVE: Dilation: 1.5 Effacement (%): 60 Station: -2 Presentation: Vertex Exam by:: Dr. Kerrie Pleasure   A&P: 21 y.o. G1P0 [redacted]w[redacted]d SOL #Labor: Progressing well. FB placed. Cytotec 50/25 placed.  #Pain: 0/10 #FWB: Cat 1 #GBS negative  Deloria Lair, DO 3:21 PM

## 2021-12-20 NOTE — MAU Note (Signed)
Jane Duke is a 21 y.o. at [redacted]w[redacted]d here in MAU reporting: started leaking fluid around 0730, fluid is clear and soaked through her clothes. No pain or bleeding. +FM  Onset of complaint: today  Pain score: 0/10  FHT:+FM  Lab orders placed from triage: fern slide

## 2021-12-20 NOTE — Lactation Note (Signed)
This note was copied from a baby's chart. Lactation Consultation Note  Patient Name: Jane Duke Date: 12/20/2021   Age:21 hours Attempted to see mom. Mom is in repair. RN stated it would be a while., RN will call for [facility] when ready.  Maternal Data    Feeding    LATCH Score                    Lactation Tools Discussed/Used    Interventions    Discharge    Consult Status      [name] 12/20/2021, 8:57 PM

## 2021-12-20 NOTE — Lactation Note (Signed)
This note was copied from a baby's chart. Lactation Consultation Note  Patient Name: Jane Duke [id number] Date: 12/20/2021 Reason for consult: L&D Initial assessment;Primapara;Term Age:21 hours Assisted baby to the breast. Baby alert. Took a little bit for baby to latch but did well. Used football hold w/support. Mom has good colostrum. Hand expression shown. Mom wanted to know if she had any. Praised mom for good BF. Mom will be f/u on MBU.  Maternal Data Has patient been taught Hand Expression?: Yes Does the patient have breastfeeding experience prior to this delivery?: No  Feeding    LATCH Score Latch: Grasps breast easily, tongue down, lips flanged, rhythmical sucking.  Audible Swallowing: None  Type of Nipple: Everted at rest and after stimulation  Comfort (Breast/Nipple): Soft / non-tender  Hold (Positioning): Full assist, staff holds infant at breast  LATCH Score: 6   Lactation Tools Discussed/Used    Interventions Interventions: Breast feeding basics reviewed;Adjust position;Assisted with latch;Support pillows;Skin to skin;Position options;Hand express;Breast compression  Discharge    Consult Status Consult Status: Follow-up from L&D Date: 12/21/21 Follow-up type: In-patient    [name] 12/20/2021, 10:09 PM

## 2021-12-21 ENCOUNTER — Other Ambulatory Visit (HOSPITAL_COMMUNITY): Payer: Self-pay

## 2021-12-21 LAB — CBC
HCT: 26.3 % — ABNORMAL LOW (ref 36.0–46.0)
Hemoglobin: 9 g/dL — ABNORMAL LOW (ref 12.0–15.0)
MCH: 29.4 pg (ref 26.0–34.0)
MCHC: 34.2 g/dL (ref 30.0–36.0)
MCV: 85.9 fL (ref 80.0–100.0)
Platelets: 233 10*3/uL (ref 150–400)
RBC: 3.06 MIL/uL — ABNORMAL LOW (ref 3.87–5.11)
RDW: 13.8 % (ref 11.5–15.5)
WBC: 20.6 10*3/uL — ABNORMAL HIGH (ref 4.0–10.5)
nRBC: 0 % (ref 0.0–0.2)

## 2021-12-21 LAB — RPR: RPR Ser Ql: NONREACTIVE

## 2021-12-21 MED ORDER — NIFEDIPINE ER 30 MG PO TB24
30.0000 mg | ORAL_TABLET | Freq: Every day | ORAL | 0 refills | Status: DC
  Filled 2021-12-21: qty 90, 90d supply, fill #0

## 2021-12-21 MED ORDER — FUROSEMIDE 20 MG PO TABS
20.0000 mg | ORAL_TABLET | Freq: Every day | ORAL | 0 refills | Status: DC
  Filled 2021-12-21: qty 4, 4d supply, fill #0

## 2021-12-21 MED ORDER — IBUPROFEN 600 MG PO TABS
600.0000 mg | ORAL_TABLET | Freq: Four times a day (QID) | ORAL | 0 refills | Status: DC
  Filled 2021-12-21: qty 30, 8d supply, fill #0

## 2021-12-21 MED ORDER — FUROSEMIDE 20 MG PO TABS
20.0000 mg | ORAL_TABLET | Freq: Every day | ORAL | Status: DC
  Administered 2021-12-21 – 2021-12-22 (×2): 20 mg via ORAL
  Filled 2021-12-21 (×2): qty 1

## 2021-12-21 MED ORDER — NIFEDIPINE ER OSMOTIC RELEASE 30 MG PO TB24
30.0000 mg | ORAL_TABLET | Freq: Every day | ORAL | Status: DC
  Administered 2021-12-21 – 2021-12-22 (×2): 30 mg via ORAL
  Filled 2021-12-21 (×2): qty 1

## 2021-12-21 NOTE — Clinical Social Work Maternal (Signed)
CLINICAL SOCIAL WORK MATERNAL/CHILD NOTE  Patient Details  Name: Jane Duke MRN: [id number] Date of Birth: 07-14-2000  Date:  12/21/2021  Clinical Social Worker Initiating Note:  Letta Kocher, LCSWA Date/Time: Initiated:  12/21/21/1557     Child's Name:  Jane Duke   Biological Parents:  Mother, Father Jane Duke [birth year] 13[birth year] 2000, Jane Duke 11/22/1998)   Need for Interpreter:  None   Reason for Referral:  Current Substance Use/Substance Use During Pregnancy     Address:  [place] [address]    Phone number:  [phone] (home)     Additional phone number:  Household Members/Support Persons (HM/SP):   Household Member/Support Person 1   HM/SP Name Relationship DOB or Age  HM/SP -1 Jane Duke siginificant other 11/22/1998  HM/SP -2        HM/SP -3        HM/SP -4        HM/SP -5        HM/SP -6        HM/SP -7        HM/SP -8          Natural Supports (not living in the home):  Parent, Extended Family   Professional Supports:     Employment: Unemployed   Type of Work:     Education:  Programmer, systems   Homebound arranged:    Museum/gallery curator Resources:  [employer]   Other Resources:  [employer], Physicist, medical     Cultural/Religious Considerations Which May Impact Care:    Strengths:  Ability to meet basic needs  , Compliance with medical plan  , Pediatrician chosen   Psychotropic Medications:         Pediatrician:    [employer]  Pediatrician List:   [place]      Pediatrician Fax Number:    Risk Factors/Current Problems:  Substance Use     Cognitive State:  Able to Concentrate  , Alert     Mood/Affect:  Comfortable  , Calm     CSW Assessment: CSW received consult for THC use during pregnancy. CSW met with MOB to complete assessment and offer support. When CSW entered the room, Mob was laying  in bed, MOB mom and brother were in the room. CSW introduced self CSW role and reason for visit. MOB agreeable to visit. CSW offered privacy MOB granted CSW verbal permission to speak in front of visitors. CSW inquired about how MOB was feeling MOB reported she was feeling okay and explained the challenges of her delivery. CSW congratulated MOB on Air cabin crew".  CSW inquired about any MH hx, MOB denied. CSW assessed for safety, MOB denied any SI or HI. MOB reported she has a solid family support system. CSW provided education regarding the baby blues period vs. perinatal mood disorders, discussed treatment and gave resources for mental health follow up if concerns arise.  CSW recommends self-evaluation during the postpartum time period using the New Mom Checklist from Postpartum Progress and encouraged MOB to contact a medical professional if symptoms are noted at any time.    CSW inquired about MOB THC use MOB reported her last use was in August and she used once or twice throughout the pregnancy to help her eat. CSW explained the hospital drug screen policy, MOB voiced understanding. CSW notified MOB if infant CDS  or UDS are positive for any substances a CPS report would be made, MOB voiced understanding.   CSW provided review of Sudden Infant Death Syndrome (SIDS) precautions.  MOB reported they have all necessary items for the infant including a bassinet for her to sleep. CSW identifies no further need for intervention and no barriers to discharge at this time.   CSW Plan/Description:  Sudden Infant Death Syndrome (SIDS) Education, No Further Intervention Required/No Barriers to Discharge, Perinatal Mood and Anxiety Disorder (PMADs) Education, CSW Will Continue to Monitor Umbilical Cord Tissue Drug Screen Results and Make Report if Warranted, [place], [place] 12/21/2021, 4:00 PM

## 2021-12-21 NOTE — Lactation Note (Addendum)
This note was copied from a baby's chart. Lactation Consultation Note  Patient Name: Girl Esabella Stockinger [id number] Date: 12/21/2021 Reason for consult: Follow-up assessment;1st time breastfeeding;Term Age:21 hours Per Birth Parent she no longer wants to latch infant at the breast. Birth Parent wants to pump only and formula feed infant. Birth Parent had pumped 8 mls of EBM that was given to infant along with 9 mls of Gerber Good Start Gentle she brought from home ( Powdered) using yellow slow flow bottle nipple, infant was given total 17 mls. Birth Parent understands on Day 2 infant's intake should be 15 to 30 mls per feeding due to Birth Parent choosing not to latch infant at the breast. Birth Parent knows to continue to feed infant according to hunger cues, 8 to 12+ times within 24 hours pace feeding infant. Birth Parent had pumped another 7 mls of EBM while LC was in the room that she will offer to infant at the next feeding.  Birth Parent understand that breast milk is safe for 4 hours at room temperature where as formula must be used within 1 hour. Birth Parent knows to call [place] if their are any BF questions or concerns. Maternal Data    Feeding Mother's Current Feeding Choice: Breast Milk and Formula Nipple Type: Slow - flow  LATCH Score                    Lactation Tools Discussed/Used    Interventions Interventions: Breast feeding basics reviewed;DEBP;Education;Pace feeding  Discharge Pump: Manual (From the DEBP kit)  Consult Status Consult Status: Follow-up Date: 12/22/21 Follow-up type: In-patient    Vicente Serene 12/21/2021, 6:28 PM

## 2021-12-21 NOTE — Progress Notes (Signed)
Post Partum Day 1 Subjective: no complaints, up ad lib, voiding and tolerating PO, small lochia, plans to breastfeed, plans outpt contraception  Objective: Blood pressure 125/85, pulse 89, temperature 99.1 F (37.3 C), temperature source Oral, resp. rate 18, height 5\' 5"  (1.651 m), weight 81.6 kg, last menstrual period 03/24/2021, SpO2 98 %, unknown if currently breastfeeding.  BPs meet criteria for starting procardia  Physical Exam:  General: alert, cooperative and no distress Lochia:normal flow Chest: CTAB Heart: RRR no m/r/g Abdomen: +BS, soft, nontender,  Uterine Fundus: firm DVT Evaluation: No evidence of DVT seen on physical exam. Extremities: trace edema  Recent Labs    12/20/21 1045 12/21/21 0515  HGB 9.6* 9.0*  HCT 29.6* 26.3*    Assessment/Plan: GHTN:  lasix/procardia, meds to beds today Anemia of pregnancy:  continue FeSO4 Plan DC tomorrow   LOS: 1 day   Christin Fudge 12/21/2021, 7:49 AM

## 2021-12-22 NOTE — Lactation Note (Signed)
This note was copied from a baby's chart. Lactation Consultation Note  Patient Name: Jane Duke [id number] Date: 12/22/2021 Reason for consult: Follow-up assessment;Exclusive pumping and bottle feeding;Term;Infant weight loss;Breastfeeding assistance (5.9% WL) Age:21 hours  LC entered the room and the infant was being held by the supporting parent.  Per the birth parent, she has been exclusively pumping and feeding the infant formula.  LC spoke with the birth parent about engorgement, breast care, warning signs, infant I/O, and outpatient services.  LC encouraged the birth parent to express her milk q3hrs.  The birth parent stated that she has been taught about milk storage guidelines.  The parents stated that they had no questions or concerns.   Current Feeding Plan:  Pump q3hrs and feed expressed milk to the infant via a bottle.  Supplement with formula as desired according to supplementation guidelines.  Watch infant output and call the pediatrician with questions or concerns.  Call the outpatient [facility] with concerns about breastfeeding.   Maternal Data Does the patient have breastfeeding experience prior to this delivery?: No  Feeding Mother's Current Feeding Choice: Breast Milk and Formula  LATCH Score                    Lactation Tools Discussed/Used    Interventions Interventions: Breast feeding basics reviewed;Education;LC Services brochure  Discharge Discharge Education: Engorgement and breast care;Warning signs for feeding baby;Outpatient recommendation  Consult Status Consult Status: Complete Date: 12/22/21 Follow-up type: Call as needed    Lysbeth Penner 12/22/2021, 11:42 AM

## 2021-12-22 NOTE — Lactation Note (Signed)
This note was copied from a baby's chart. Lactation Consultation Note  Patient Name: Jane Duke [id number] Date: 12/22/2021   Age:21 hours  LC attempted to visit with the birth parent, but the family was asleep. Lactation will try to follow up later.   Maternal Data    Feeding    LATCH Score                    Lactation Tools Discussed/Used    Interventions    Discharge    Consult Status      Lysbeth Penner 12/22/2021, 10:16 AM

## 2021-12-25 ENCOUNTER — Other Ambulatory Visit: Payer: Medicaid Other

## 2021-12-25 ENCOUNTER — Encounter: Payer: Medicaid Other | Admitting: Women's Health

## 2021-12-27 ENCOUNTER — Encounter: Payer: Self-pay | Admitting: Women's Health

## 2021-12-27 ENCOUNTER — Ambulatory Visit (INDEPENDENT_AMBULATORY_CARE_PROVIDER_SITE_OTHER): Payer: Medicaid Other | Admitting: *Deleted

## 2021-12-27 ENCOUNTER — Encounter: Payer: Self-pay | Admitting: Advanced Practice Midwife

## 2021-12-27 ENCOUNTER — Encounter: Payer: Self-pay | Admitting: *Deleted

## 2021-12-27 VITALS — BP 129/83 | HR 89 | Ht 66.0 in | Wt 157.0 lb

## 2021-12-27 DIAGNOSIS — Z0130 Encounter for examination of blood pressure without abnormal findings: Secondary | ICD-10-CM

## 2021-12-27 NOTE — Progress Notes (Signed)
   NURSE VISIT- BLOOD PRESSURE CHECK  SUBJECTIVE:  Jane Duke is a 21 y.o. G60P1001 female here for BP check. She is postpartum, delivery date 12/20/21.     HYPERTENSION ROS:  Pregnant/postpartum:  Severe headaches that don't go away with tylenol/other medicines: No  Visual changes (seeing spots/double/blurred vision) No  Severe pain under right breast breast or in center of upper chest No  Severe nausea/vomiting No  Taking medicines as instructed yes   OBJECTIVE:  BP 129/83 (BP Location: Left Arm, Patient Position: Sitting, Cuff Size: Normal)   Pulse 89   Ht 5\' 6"  (1.676 m)   Wt 157 lb (71.2 kg)   Breastfeeding No   BMI 25.34 kg/m   Appearance alert, well appearing, and in no distress.  ASSESSMENT: Postpartum  blood pressure check  PLAN: Discussed with Dr. Nelda Marseille   Recommendations: stop medicine 2 days before next visit   Follow-up: as scheduled   Levy Pupa  12/27/2021 3:10 PM

## 2022-01-31 ENCOUNTER — Ambulatory Visit: Payer: Medicaid Other | Admitting: Advanced Practice Midwife

## 2022-02-07 ENCOUNTER — Encounter: Payer: Self-pay | Admitting: Advanced Practice Midwife

## 2022-02-07 ENCOUNTER — Ambulatory Visit (INDEPENDENT_AMBULATORY_CARE_PROVIDER_SITE_OTHER): Payer: Medicaid Other | Admitting: Advanced Practice Midwife

## 2022-02-07 ENCOUNTER — Other Ambulatory Visit (HOSPITAL_COMMUNITY)
Admission: RE | Admit: 2022-02-07 | Discharge: 2022-02-07 | Disposition: A | Discharge status: Home / Self Care | Payer: Medicaid Other | Source: Ambulatory Visit | Attending: Advanced Practice Midwife | Admitting: Advanced Practice Midwife

## 2022-02-07 VITALS — BP 136/88 | HR 98 | Ht 66.0 in | Wt 150.5 lb

## 2022-02-07 DIAGNOSIS — Z124 Encounter for screening for malignant neoplasm of cervix: Secondary | ICD-10-CM

## 2022-02-07 DIAGNOSIS — O139 Gestational [pregnancy-induced] hypertension without significant proteinuria, unspecified trimester: Secondary | ICD-10-CM

## 2022-02-07 MED ORDER — NIFEDIPINE ER 30 MG PO TB24: 30.0000 mg | ORAL_TABLET | Freq: Every day | ORAL | 0 refills | Status: DC

## 2022-02-07 NOTE — Progress Notes (Signed)
Post Partum Visit Note   Chief Complaint:   Postpartum Care (Pap today)  History of Present Illness:   Jane Duke is a 21 y.o. G51P1001  female being seen today for a postpartum visit. She is 6 weeks postpartum following a spontaneous vaginal delivery at 40 gestational weeks. IOL: No, . Anesthesia: epidural.  Laceration: 1st degree.  Complications: none. Inpatient contraception: no.   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  Tobacco use: no. Substance use disorder: no. Last pap smear: never Patient's last menstrual period was 02/04/2022.  Postpartum course has been uncomplicated. Bleeding no bleeding. Bowel function is normal. Bladder function is normal. Urinary incontinence? No, fecal incontinence? No Patient is sexually active. Last sexual activity: 2 days ago.    Upstream - 02/07/22 1401       Pregnancy Intention Screening   Does the patient want to become pregnant in the next year? No    Does the patient's partner want to become pregnant in the next year? No    Would the patient like to discuss contraceptive options today? No      Contraception Wrap Up   Current Method Female Condom    End Method Female Condom            The pregnancy intention screening data noted above was reviewed. Potential methods of contraception were discussed. The patient elected to proceed with Female Condom.  Edinburgh Postpartum Depression Screening: Negative  Edinburgh Postnatal Depression Scale - 02/07/22 1403       Edinburgh Postnatal Depression Scale:  In the Past 7 Days   I have been able to laugh and see the funny side of things. 0    I have looked forward with enjoyment to things. 0    I have blamed myself unnecessarily when things went wrong. 3    I have been anxious or worried for no good reason. 0    I have felt scared or panicky for no good reason. 1    Things have been getting on top of me. 1    I have been so unhappy that I have had  difficulty sleeping. 0    I have felt sad or miserable. 0    I have been so unhappy that I have been crying. 0    The thought of harming myself has occurred to me. 0    Edinburgh Postnatal Depression Scale Total 5            Baby's course has been uncomplicated. Baby is feeding by bottle. Infant has a pediatrician/family doctor? Yes.  Childcare strategy if returning to work/school: yes.  Pt has material needs met for her and baby: Yes.   Review of Systems:   Pertinent items are noted in HPI Denies Abnormal vaginal discharge w/ itching/odor/irritation, headaches, visual changes, shortness of breath, chest pain, abdominal pain, severe nausea/vomiting, or problems with urination or bowel movements. Pertinent History Reviewed:  Reviewed past medical,surgical, obstetrical and family history.  Reviewed problem list, medications and allergies. OB History  Gravida Para Term Preterm AB Living  _0 SAB IAB Ectopic Multiple Live Births        0 1    # Outcome Date GA Lbr Len/2nd Weight Sex Delivery Anes PTL Lv  1 Term 12/20/21 48w0d12:47 / 00:17 7 lb 7 oz (3.374 kg) F Vag-Spont None  LIV   Physical Assessment:   Vitals:   02/07/22 1358  02/07/22 1406  BP: (!) 141/86 136/88  Pulse: 90 98  Weight: 150 lb 8 oz (68.3 kg)   Height: _0  (1.676 m)   Body mass index is 24.29 kg/m.  Objective:  Blood pressure 136/88, pulse 98, height _1  (1.676 m), weight 150 lb 8 oz (68.3 kg), last menstrual period 02/04/2022, not currently breastfeeding.  General:  alert, cooperative, and no distress   Breasts:  negative  Lungs: Normal respiratory effort  Heart:  regular rate and rhythm  Abdomen: soft, non-tender,    Vulva:  normal  Vagina: normal vagina  Cervix:  Normal  PAP COLLECTED  Corpus: Well involuted  Adnexa:  not evaluated  Rectal Exam: no hemorrhoids          No results found for this or any previous visit (from the past 24 hour(s)).  Assessment & Plan:  1) Postpartum  exam 2) 6 wks s/p spontaneous vaginal delivery; GHTN, BP still elevated; restart procardia, DC in 2 weeks and BP check in 2.5 weeks (don't take med day of appt, keep eye on BP at home, if BP drops low, stop med and let us know) 3) bottle feeding 4) Depression screening 5) Contraception counseling  Essential components of care per ACOG recommendations:  1.  Mood and well being:  If positive depression screen, discussed and plan developed.  If using tobacco we discussed reduction/cessation and risk of relapse If current substance abuse, we discussed and referral to local resources was offered.   2. Infant care and feeding:  If breastfeeding, discussed returning to work, pumping, breastfeeding-associated pain, guidance regarding return to fertility while lactating if not using another method. If needed, patient was provided with a letter to be allowed to pump q 2-3hrs to support lactation in a private location with access to a refrigerator to store breastmilk.   Recommended that all caregivers be immunized for flu, pertussis and other preventable communicable diseases If pt does not have material needs met for her/baby, referred to local resources for help obtaining these.  3. Sexuality, contraception and birth spacing Provided guidance regarding sexuality, management of dyspareunia, and resumption of intercourse Discussed avoiding interpregnancy interval <69mhs and recommended birth spacing of 18 months  4. Sleep and fatigue Discussed coping options for fatigue and sleep disruption Encouraged family/partner/community support of 4 hrs of uninterrupted sleep to help with mood and fatigue  5. Physical recovery  If pt had a C/S, assessed incisional pain and providing guidance on normal vs prolonged recovery If pt had a laceration, perineal healing and pain reviewed.  If urinary or fecal incontinence, discussed management and referred to PT or uro/gyn if indicated  Patient is safe to resume  physical activity. Discussed attainment of healthy weight.  6.  Chronic disease management Discussed pregnancy complications if any, and their implications for future childbearing and long-term maternal health. Review recommendations for prevention of recurrent pregnancy complications, such as aspirin to reduce risk of preeclampsia yes. Pt had GDM: No. If yes, 2hr GTT scheduled: not applicable. Reviewed medications and non-pregnant dosing including consideration of whether pt is breastfeeding using a reliable resource such as LactMed: not applicable Referred for f/u w/ PCP or subspecialist providers as indicated: not applicable  7. Health maintenance Mammogram at [id number] earlier if indicated Pap smears as indicated  Meds:  Meds ordered this encounter  Medications   NIFEdipine (ADALAT CC) 30 MG 24 hr tablet    Sig: Take 1 tablet (30 mg total) by mouth daily.    Dispense:  90 tablet    Refill:  0    Order Specific Question:   Supervising Provider    Answer:   Tania Ade H [[id number]]    Follow-up: Return 2.5 weeks, RN BP virtual check.   No orders of the defined types were placed in this encounter.      [place] for [employer], [facility] 02/07/2022 2:24 PM

## 2022-02-12 LAB — CYTOLOGY - PAP
Adequacy: ABSENT
Chlamydia: NEGATIVE
Comment: NEGATIVE
Comment: NORMAL
Diagnosis: NEGATIVE
Neisseria Gonorrhea: NEGATIVE

## 2022-02-28 ENCOUNTER — Telehealth: Payer: Medicaid Other | Admitting: Advanced Practice Midwife

## 2022-07-08 IMAGING — US US OB COMP LESS 14 WK
1 series · 14 of 28 positions shown · non-contrast
Comparison: None.

CLINICAL DATA: Vaginal bleeding. Quantitative beta hCG not
available at time dictation.

EXAM:
OBSTETRIC <14 WK ULTRASOUND
TECHNIQUE: Transabdominal ultrasound was performed for evaluation of the
gestation as well as the maternal uterus and adnexal regions.

[Series 1: us ob less than 14 weeks with ob transvaginal · 67 acquisitions, 14 frames shown]
[im 3/67]
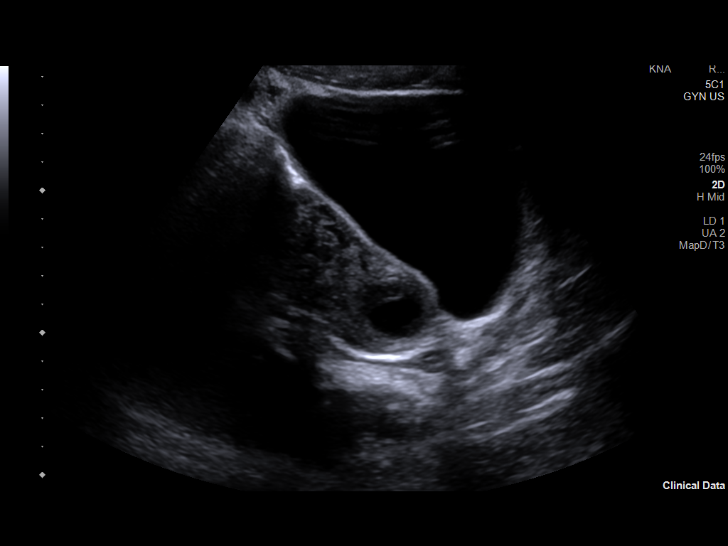
[im 8/67]
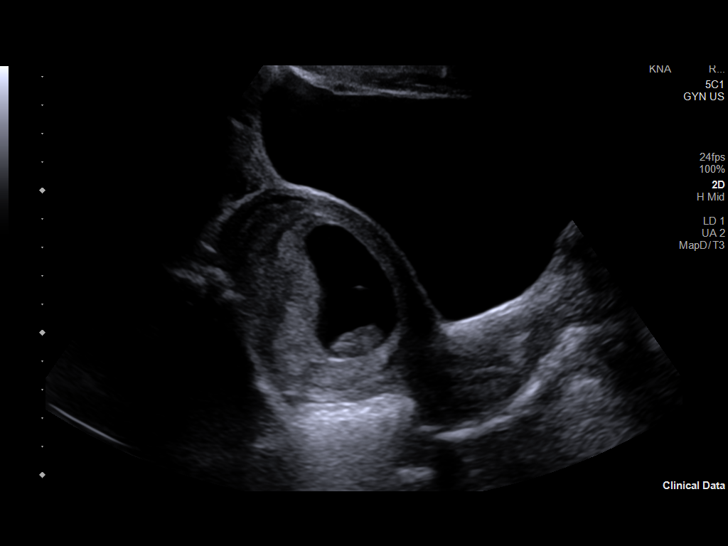
[im 13/67]
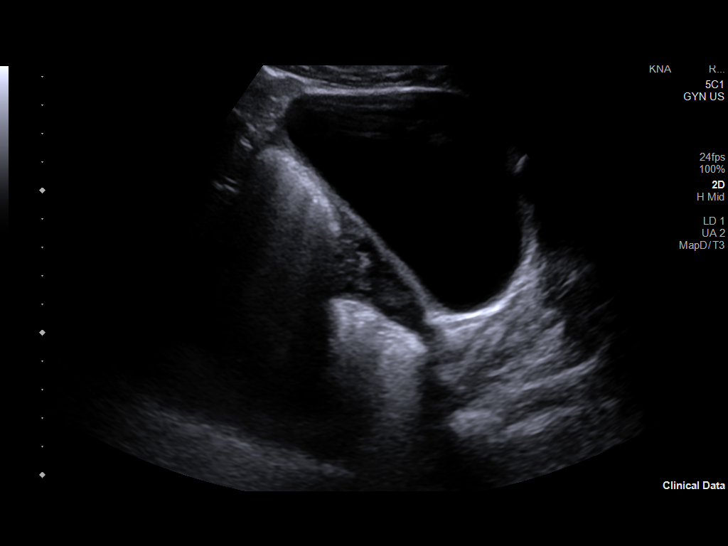
[im 18/67]
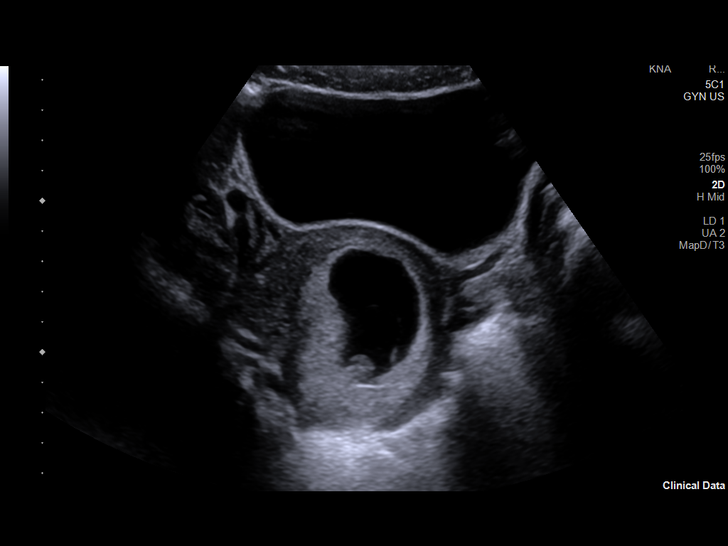
[im 23/67]
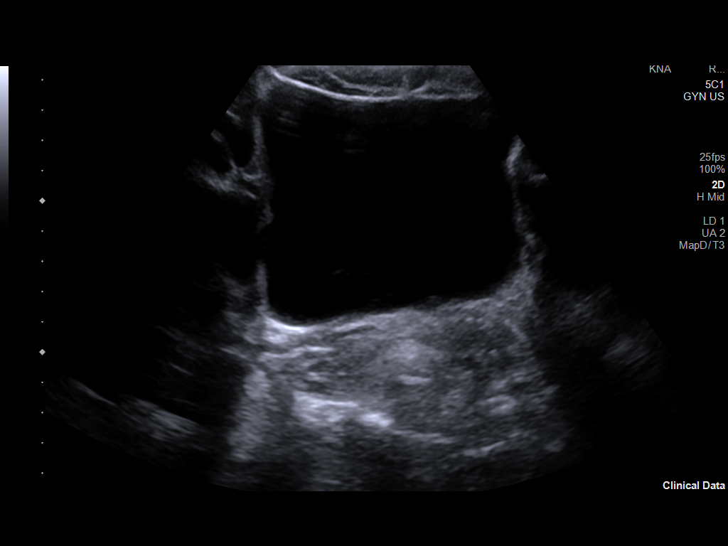
[im 27/67]
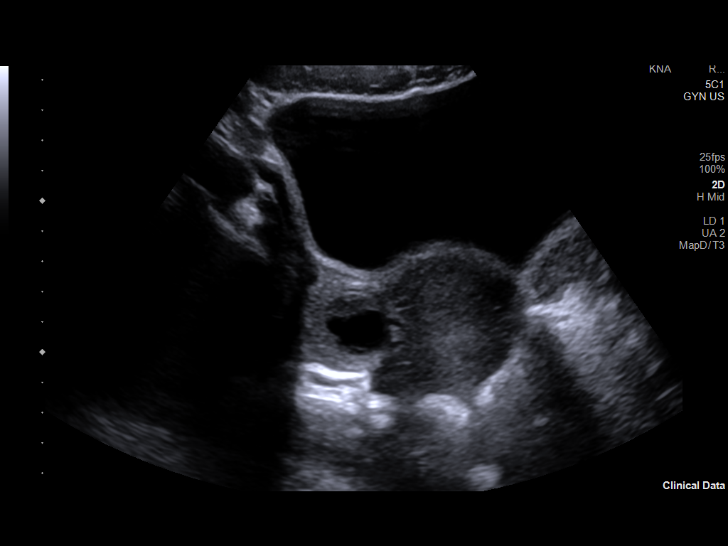
[im 32/67]
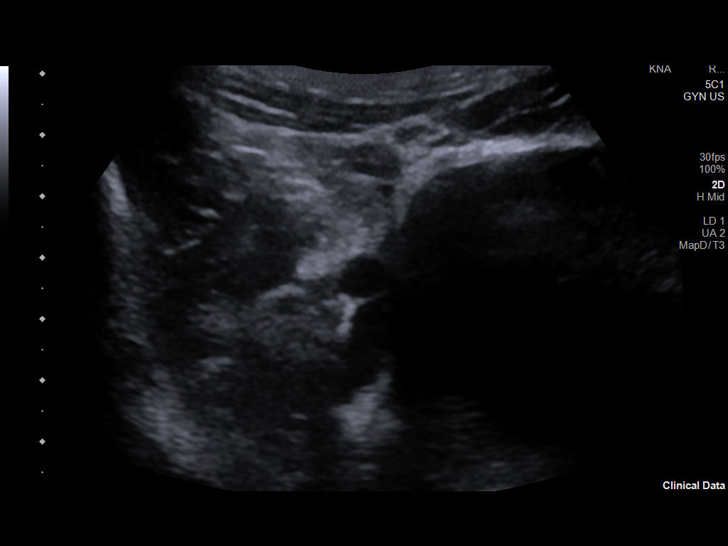
[im 37/67]
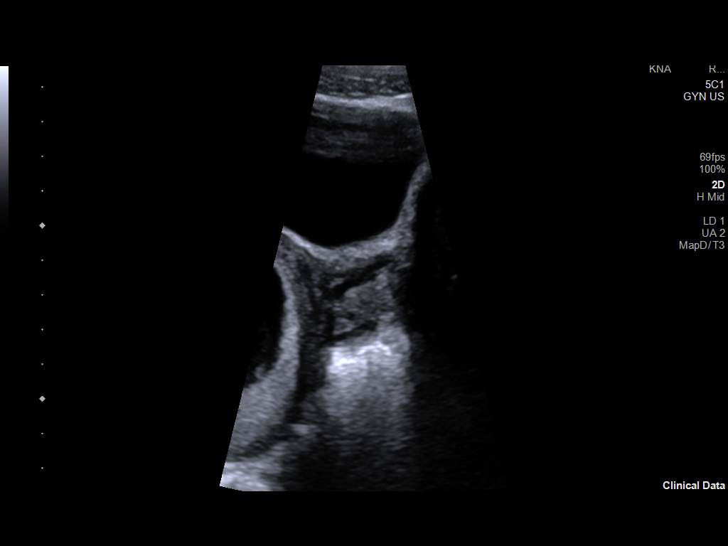
[im 42/67]
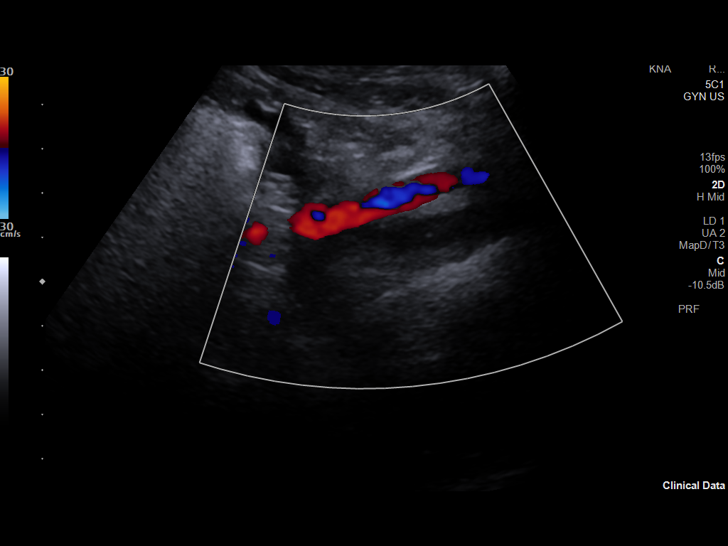
[im 47/67]
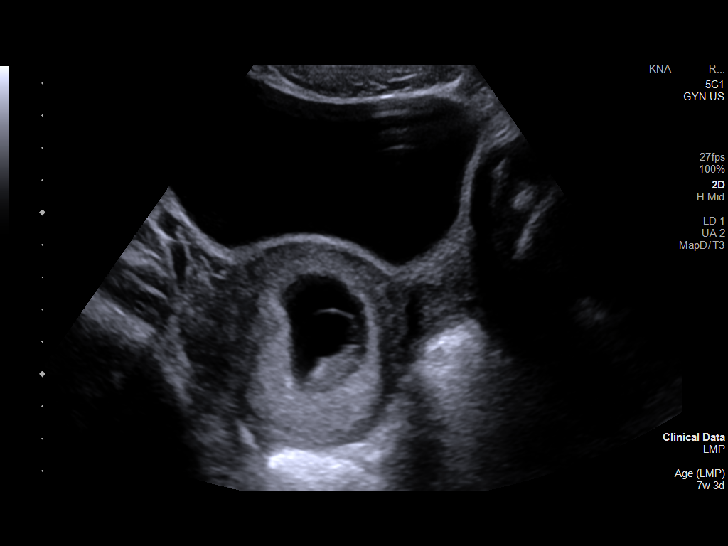
[im 52/67]
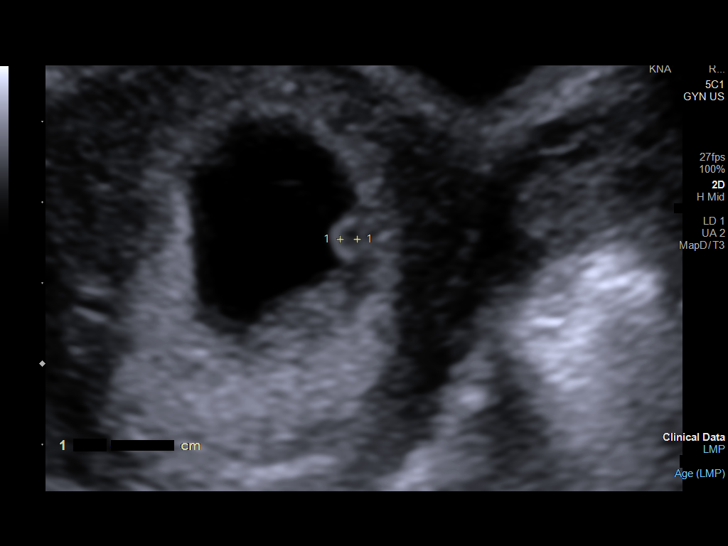
[im 57/67]
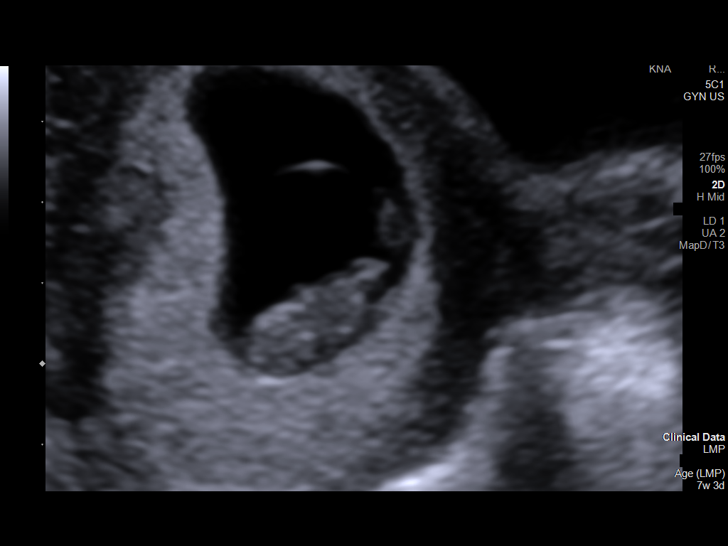
[im 62/67]
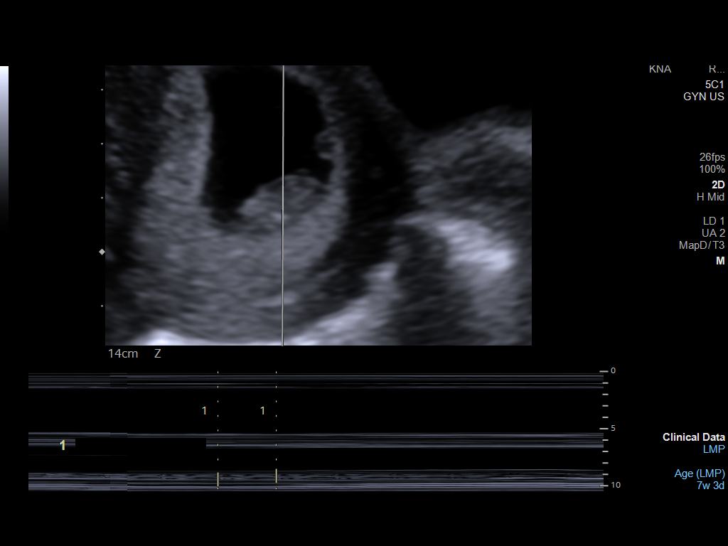
[im 67/67]
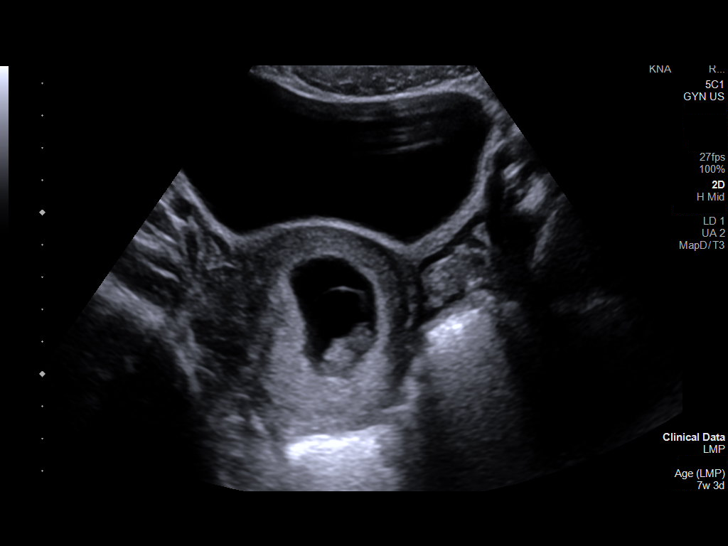

[14 of 28 positions shown; findings below may reference images not displayed]

FINDINGS: Intrauterine gestational sac: Single

Yolk sac:  Visualized.

Embryo:  Visualized.

Cardiac Activity: Visualized.

Heart Rate: 178 bpm

CRL: 20.7 mm   8 w 5 d                  US EDC: December 20, 2021

Subchorionic hemorrhage:  None visualized.

Maternal uterus/adnexae: Corpus luteum in the right ovary.
IMPRESSION: Early single viable intrauterine pregnancy measured at 8 weeks 5
days gestation.

## 2023-02-14 ENCOUNTER — Other Ambulatory Visit (HOSPITAL_COMMUNITY): Payer: Self-pay

## 2023-02-14 ENCOUNTER — Other Ambulatory Visit: Payer: Self-pay

## 2023-08-11 ENCOUNTER — Ambulatory Visit (INDEPENDENT_AMBULATORY_CARE_PROVIDER_SITE_OTHER)

## 2023-08-11 DIAGNOSIS — Z3201 Encounter for pregnancy test, result positive: Secondary | ICD-10-CM | POA: Diagnosis not present

## 2023-08-11 LAB — POCT PREGNANCY, URINE: Preg Test, Ur: POSITIVE — AB

## 2023-08-11 NOTE — Progress Notes (Signed)
 Pt left urine for walk in UPT resulting positive.  Pt denies VB and abdominal pain.  Pt states that she has been having headaches that sometimes go away with Tylenol .  I asked pt if she has her BP cuff from last pregnancy and if so what was her reading.  Pt states that her reading was 130's/90's.  Pt advised to continue to monitor her BP with her prior history and to go to MAU if she has increased BP and headache that does not go away with Tylenol .   Pt reports LMP 07/09/23 which makes her EDD 04/15/23 and 4w 5d today.  Medications and allergies reviewed.  Pt provided with list of OB providers and medications safe to take in pregnancy.  Pt advised when to go to MAU for evaluation. Pt encouraged to start [facility] and PNV.  Pt verbalized understanding with no further questions.   Jane Wittke, RN  08/11/23

## 2023-08-11 NOTE — Patient Instructions (Signed)
[facility] Prenatal Care Providers          [facility] locations:  Hours may vary. Please call for an appointment  [facility] at [facility]             [address], [place], [place] [address] [phone]  [facility] at [facility]                                                             [address], [address], [place], [place], [address] [phone]  [facility] at [facility]                                    [address], [address], [place], [place], [address] [phone]  [facility] at [place] [address], [address], [place], [place], [address] [phone]  [facility] at [facility]                                 [address], [place], [place], [address] [phone]  [facility] at [facility]                                    [address], [place], [place], [address] [phone]  [facility] at [facility] [address], [address], [place], [place], [address]                                                   Safe Medications in Pregnancy    Acne: Benzoyl Peroxide Salicylic Acid  Backache/Headache: Tylenol: 2 regular strength every 4 hours OR              2 Extra strength every 6 hours  Colds/Coughs/Allergies: Benadryl (alcohol free) 25 mg every 6 hours as needed Breath right strips Claritin Cepacol throat lozenges Chloraseptic throat spray Cold-Eeze- up to three times per day Cough drops, alcohol free Flonase (by prescription only) Guaifenesin Mucinex Robitussin DM (plain only, alcohol free) Saline nasal spray/drops Sudafed (pseudoephedrine) & Actifed ** use only after [redacted] weeks gestation and if you do not have high blood pressure Tylenol Vicks Vaporub Zinc lozenges Zyrtec   Constipation: Colace Ducolax suppositories Fleet enema Glycerin  suppositories Metamucil Milk of magnesia Miralax Senokot Smooth move tea  Diarrhea: Kaopectate Imodium A-D  *NO pepto Bismol  Hemorrhoids: Anusol Anusol HC Preparation H Tucks  Indigestion: Tums Maalox Mylanta Zantac  Pepcid  Insomnia: Benadryl (alcohol free) 25mg every 6 hours as needed Tylenol PM Unisom, no Gelcaps  Leg Cramps: Tums MagGel  Nausea/Vomiting:  Bonine Dramamine Emetrol Ginger extract Sea bands Meclizine  Nausea medication to take during pregnancy:  Unisom (doxylamine succinate 25 mg tablets) Take one tablet daily at bedtime. If symptoms are not adequately controlled, the dose can be increased to a maximum recommended dose of two   tablets daily (1/2 tablet in the morning, 1/2 tablet mid-afternoon and one at bedtime). Vitamin B6 100mg tablets. Take one tablet twice a day (up to 200 mg per day).  Skin Rashes: Aveeno products Benadryl cream or 25mg every 6 hours as needed Calamine Lotion 1% cortisone cream  Yeast infection: Gyne-lotrimin 7 Monistat 7   **If taking multiple medications, please check labels to avoid duplicating the same active ingredients **take medication as directed on the label ** Do not exceed 4000 mg of tylenol in 24 hours **Do not take medications that contain aspirin or ibuprofen    

## 2023-10-22 ENCOUNTER — Encounter: Payer: Self-pay | Admitting: *Deleted

## 2023-10-22 ENCOUNTER — Telehealth: Admitting: *Deleted

## 2023-10-22 ENCOUNTER — Inpatient Hospital Stay (HOSPITAL_COMMUNITY)
Admission: AD | Admit: 2023-10-22 | Discharge: 2023-10-22 | Disposition: A | Discharge status: Home / Self Care | Attending: Obstetrics & Gynecology | Admitting: Obstetrics & Gynecology

## 2023-10-22 ENCOUNTER — Telehealth: Payer: Self-pay | Admitting: *Deleted

## 2023-10-22 DIAGNOSIS — O0990 Supervision of high risk pregnancy, unspecified, unspecified trimester: Secondary | ICD-10-CM | POA: Insufficient documentation

## 2023-10-22 DIAGNOSIS — Z8759 Personal history of other complications of pregnancy, childbirth and the puerperium: Secondary | ICD-10-CM | POA: Insufficient documentation

## 2023-10-22 DIAGNOSIS — O26893 Other specified pregnancy related conditions, third trimester: Secondary | ICD-10-CM

## 2023-10-22 DIAGNOSIS — R109 Unspecified abdominal pain: Secondary | ICD-10-CM

## 2023-10-22 DIAGNOSIS — Z3492 Encounter for supervision of normal pregnancy, unspecified, second trimester: Secondary | ICD-10-CM | POA: Diagnosis present

## 2023-10-22 DIAGNOSIS — Z3A15 15 weeks gestation of pregnancy: Secondary | ICD-10-CM | POA: Diagnosis not present

## 2023-10-22 DIAGNOSIS — Z3490 Encounter for supervision of normal pregnancy, unspecified, unspecified trimester: Secondary | ICD-10-CM | POA: Insufficient documentation

## 2023-10-22 NOTE — Telephone Encounter (Signed)
 Jane Duke was scheduled for a virtual new ob intake appointment for 1:15. She was connected virtually but I could not see or hear her. She sent a message she could not unmute. I verbally asked for her to check audio/ camera settings and /or log out/ back in.  I sent a message I am calling her to do visit. I called her  4 times between 1:19 and 1:32pm and did not connect with her. I have informed front office to reschedule her. I will send a MyChart message. Rock Skip PEAK

## 2023-10-22 NOTE — MAU Provider Note (Signed)
 MAU Attending Note   S Ms. Jane Duke is a 23 y.o. G2P1001  at [redacted]w[redacted]d who presents to MAU today with complaint of upper abdominal pain earlier today that resolved after eating.  No other symptoms currently. Denies any abnormal vaginal discharge, vaginal bleeding, fevers, chills, sweats, dysuria, nausea, vomiting, other GI or GU symptoms or other general symptoms.    O BP 114/60 (BP Location: Right Arm)   Pulse (!) 101   Temp 98.8 F (37.1 C) (Oral)   Resp 14   Ht 5' 6 (1.676 m)   Wt 67.7 kg   LMP 07/09/2023   SpO2 100%   BMI 24.08 kg/m  VS reviewed, nursing note reviewed  FHR 158 bpm Constitutional: well developed, well nourished, no distress HEENT: normocephalic CV: normal rate Pulm: normal effort Abdomen: soft, NT Neuro: alert and oriented x 3 Skin: warm, dry Psych: affect normal  A Medical screening exam complete Resolved abdominal pain in pregnancy [redacted] weeks gestation  P Reassured patient that since her symptoms were alleviated by eating, no further intervention needed Discussed that this could have been heartburn in pregnancy, gave options for OTC management. Warning signs for worsening condition that would warrant emergency follow-up discussed. Patient may return to MAU as needed for pregnancy related complaints. Discharge from MAU in stable condition   Gloris Hugger, MD 10/22/2023 3:56 PM

## 2023-10-22 NOTE — MAU Note (Signed)
..  Jane Duke is a 23 y.o. at [redacted]w[redacted]d here in MAU reporting: Earlier today around 1200 she noticed she was having upper abdominal cramping. She reports that it would come every 2 minutes and then go away. She has since eaten lunch and the pain has now gone away. Denies lower abdominal pain, vaginal bleeding, LOF, or discharge. Denies recent intercourse.   Pain score: 0 Vitals:   10/22/23 1526  BP: 114/60  Pulse: (!) 101  Resp: 14  Temp: 98.8 F (37.1 C)  SpO2: 100%     FHT:158 Lab orders placed from triage:  UA

## 2023-10-29 NOTE — Progress Notes (Signed)
 Patient did not connect successfully  for this visit. New OB Intake not completed. Rock Skip PEAK

## 2023-11-05 ENCOUNTER — Ambulatory Visit (INDEPENDENT_AMBULATORY_CARE_PROVIDER_SITE_OTHER): Admitting: Obstetrics and Gynecology

## 2023-11-05 ENCOUNTER — Telehealth

## 2023-11-05 ENCOUNTER — Encounter: Payer: Self-pay | Admitting: Obstetrics and Gynecology

## 2023-11-05 VITALS — BP 108/69 | HR 86 | Wt 152.2 lb

## 2023-11-05 DIAGNOSIS — Z1332 Encounter for screening for maternal depression: Secondary | ICD-10-CM

## 2023-11-05 DIAGNOSIS — Z3A17 17 weeks gestation of pregnancy: Secondary | ICD-10-CM | POA: Diagnosis not present

## 2023-11-05 DIAGNOSIS — J321 Chronic frontal sinusitis: Secondary | ICD-10-CM | POA: Insufficient documentation

## 2023-11-05 DIAGNOSIS — Z3492 Encounter for supervision of normal pregnancy, unspecified, second trimester: Secondary | ICD-10-CM | POA: Diagnosis not present

## 2023-11-05 DIAGNOSIS — Z8759 Personal history of other complications of pregnancy, childbirth and the puerperium: Secondary | ICD-10-CM

## 2023-11-05 DIAGNOSIS — Z3490 Encounter for supervision of normal pregnancy, unspecified, unspecified trimester: Secondary | ICD-10-CM

## 2023-11-05 MED ORDER — ASPIRIN 81 MG PO CHEW: 81.0000 mg | CHEWABLE_TABLET | Freq: Every day | ORAL | 2 refills | Status: AC

## 2023-11-05 NOTE — Patient Instructions (Signed)
   Considering Waterbirth? Guide for patients at [facility]) Why consider waterbirth? Gentle birth for babies  Less pain medicine used in labor  May allow for passive descent/less pushing  May reduce perineal tears  More mobility and instinctive maternal position changes  Increased maternal relaxation   Is waterbirth safe? What are the risks of infection, drowning or other complications? Infection:  Very low risk (3.7 % for tub vs 4.8% for bed)  7 in 8000 waterbirths with documented infection  Poorly cleaned equipment most common cause  Slightly lower group B strep transmission rate  Drowning  Maternal:  Very low risk  Related to seizures or fainting  Newborn:  Very low risk. No evidence of increased risk of respiratory problems in multiple large studies  Physiological protection from breathing under water  Avoid underwater birth if there are any fetal complications  Once baby's head is out of the water, keep it out.  Birth complication  Some reports of cord trauma, but risk decreased by bringing baby to surface gradually  No evidence of increased risk of shoulder dystocia. Mothers can usually change positions faster in water than in a bed, possibly aiding the maneuvers to free the shoulder.   There are 2 things you MUST do to have a waterbirth with [facility]: Attend a waterbirth class at [facility]   3rd Wednesday of every month from 7-9 pm (virtual during COVID) [employer] at [email or web address] or [email or web address] or by calling [phone] Bring us  the certificate from the class to your prenatal appointment or send via MyChart Meet with a midwife at 36 weeks* to see if you can still plan a waterbirth and to sign the consent.   *We also recommend that you schedule as many of your prenatal visits with a midwife as possible.    Helpful information: You may want to bring a bathing suit top to the hospital  to wear during labor but this is optional.  All other supplies are provided by the hospital. Please arrive at the hospital with signs of active labor, and do not wait at home until late in labor. It takes 45 min- 1 hour for fetal monitoring, and check in to your room to take place, plus transport and filling of the waterbirth tub.    Things that would prevent you from having a waterbirth: Premature, <37wks  Previous cesarean birth  Presence of thick meconium-stained fluid  Multiple gestation (Twins, triplets, etc.)  Uncontrolled diabetes or gestational diabetes requiring medication  Hypertension diagnosed in pregnancy or preexisting hypertension (gestational hypertension, preeclampsia, or chronic hypertension) Fetal growth restriction (your baby measures less than 10th percentile on ultrasound) Heavy vaginal bleeding  Non-reassuring fetal heart rate  Active infection (MRSA, etc.). Group B Strep is NOT a contraindication for waterbirth.  If your labor has to be induced and induction method requires continuous monitoring of the baby's heart rate  Other risks/issues identified by your obstetrical provider   Please remember that birth is unpredictable. Under certain unforeseeable circumstances your provider may advise against giving birth in the tub. These decisions will be made on a case-by-case basis and with the safety of you and your baby as our highest priority.    Updated 06/06/21

## 2023-11-05 NOTE — Progress Notes (Signed)
 INITIAL PRENATAL VISIT NOTE  Subjective:  Jane Duke is a 23 y.o. G2P1001 at [redacted]w[redacted]d by LMP being seen today for her initial prenatal visit. T She has an obstetric history significant for SVD. She has an uncomplicated medical history.  Patient reports no complaints.  Contractions: Not present. Vag. Bleeding: None.  Movement: Absent. Denies leaking of fluid.    History reviewed. No pertinent past medical history.  Past Surgical History:  Procedure Laterality Date   NO PAST SURGERIES      OB History  Gravida Para Term Preterm AB Living  2 1 1  0 0 1  SAB IAB Ectopic Multiple Live Births  0 0 0 0 1    # Outcome Date GA Lbr Len/2nd Weight Sex Type Anes PTL Lv  2 Current           1 Term 12/20/21 [redacted]w[redacted]d 12:47 / 00:17 7 lb 7 oz (3.374 kg) F Vag-Spont None  LIV    Social History   Socioeconomic History   Marital status: Single    Spouse name: Not on file   Number of children: Not on file   Years of education: Not on file   Highest education level: Not on file  Occupational History   Not on file  Tobacco Use   Smoking status: Some Days    Types: Cigars   Smokeless tobacco: Never  Vaping Use   Vaping status: Never Used  Substance and Sexual Activity   Alcohol use: Not Currently    Comment: last alcohol intake 2024   Drug use: Yes    Types: Marijuana   Sexual activity: Yes    Partners: Male    Birth control/protection: None  Other Topics Concern   Not on file  Social History Narrative   Not on file   Social Drivers of Health   Financial Resource Strain: Low Risk  (10/04/2021)   Overall Financial Resource Strain (CARDIA)    Difficulty of Paying Living Expenses: Not very hard  Food Insecurity: No Food Insecurity (12/20/2021)   Hunger Vital Sign    Worried About Running Out of Food in the Last Year: Never true    Ran Out of Food in the Last Year: Never true  Transportation Needs: No Transportation Needs (12/20/2021)   PRAPARE - Scientist, research (physical sciences) (Medical): No    Lack of Transportation (Non-Medical): No  Physical Activity: Insufficiently Active (10/04/2021)   Exercise Vital Sign    Days of Exercise per Week: 3 days    Minutes of Exercise per Session: 10 min  Stress: No Stress Concern Present (10/04/2021)   [employer] of Occupational Health - Occupational Stress Questionnaire    Feeling of Stress : Only a little  Social Connections: Moderately Integrated (10/04/2021)   Social Connection and Isolation Panel    Frequency of Communication with Friends and Family: Three times a week    Frequency of Social Gatherings with Friends and Family: Three times a week    Attends Religious Services: 1 to 4 times per year    Active Member of Clubs or Organizations: No    Attends Banker Meetings: Never    Marital Status: Living with partner    Family History  Problem Relation Age of Onset   Hypertension Mother    Hypertension Maternal Grandmother    Kidney failure Maternal Grandmother    Diabetes Maternal Grandmother      Current Outpatient Medications:    aspirin  81 MG chewable  tablet, Chew 1 tablet (81 mg total) by mouth daily., Disp: 90 tablet, Rfl: 2   Prenatal Vit-Fe Fumarate-FA (MULTIVITAMIN-PRENATAL) 27-0.8 MG TABS tablet, Take 1 tablet by mouth daily at 12 noon., Disp: , Rfl:   No Known Allergies  Review of Systems: Negative except for what is mentioned in HPI.  Objective:   Vitals:   11/05/23 1426  BP: 108/69  Pulse: 86  Weight: 152 lb 3.2 oz (69 kg)    Fetal Status: Fetal Heart Rate (bpm): 164   Movement: Absent     Physical Exam: BP 108/69   Pulse 86   Wt 152 lb 3.2 oz (69 kg)   LMP 07/09/2023 (Exact Date)   BMI 24.57 kg/m  CONSTITUTIONAL: Well-developed, well-nourished female in no acute distress.  NEUROLOGIC: Alert and oriented to person, place, and time. Normal reflexes, muscle tone coordination. No cranial nerve deficit noted. PSYCHIATRIC: Normal mood and affect. Normal  behavior. Normal judgment and thought content. SKIN: Skin is warm and dry. No rash noted. Not diaphoretic. No erythema. No pallor. HENT:  Normocephalic, atraumatic, External right and left ear normal. Oropharynx is clear and moist EYES: Conjunctivae and EOM are normal.  NECK: Normal range of motion, supple, no masses CARDIOVASCULAR: Normal heart rate noted, regular rhythm RESPIRATORY: Effort and breath sounds normal, no problems with respiration noted BREASTS: deferred ABDOMEN: Soft, nontender, nondistended, gravid. GU: deferred MUSCULOSKELETAL: Normal range of motion. EXT:  No edema and no tenderness. 2+ distal pulses.   Assessment and Plan:  Pregnancy: G2P1001 at [redacted]w[redacted]d by LMP  1. Encounter for supervision of low-risk pregnancy in second trimester (Primary) Continue routine prenatal care  - CBC/D/Plt+RPR+Rh+ABO+RubIgG... - HgB A1c - Culture, OB Urine - GC/Chlamydia probe amp ([place])not at [facility] PRENATAL TEST - AFP, Serum, Open Spina Bifida - Protein / creatinine ratio, urine  2. History of gestational hypertension Check baseline labs, start baby ASA due to history  - Protein / creatinine ratio, urine - Comp Met (CMET) - aspirin  81 MG chewable tablet; Chew 1 tablet (81 mg total) by mouth daily.  Dispense: 90 tablet; Refill: 2  3. [redacted] weeks gestation of pregnancy    Preterm labor symptoms and general obstetric precautions including but not limited to vaginal bleeding, contractions, leaking of fluid and fetal movement were reviewed in detail with the patient.  Please refer to After Visit Summary for other counseling recommendations.   Return in about 4 weeks (around 12/03/2023) for ROB, in person.  Jerilynn DELENA Buddle 11/05/2023 4:54 PM

## 2023-11-05 NOTE — Progress Notes (Signed)
 New OB Intake  I connected with Jane Duke  on 11/05/23 at 11:15 AM EDT by MyChart Video Visit and verified that I am speaking with the correct person using two identifiers. Nurse is located at [facility] and pt is located at home.  I discussed the limitations, risks, security and privacy concerns of performing an evaluation and management service by telephone and the availability of in person appointments. I also discussed with the patient that there may be a patient responsible charge related to this service. The patient expressed understanding and agreed to proceed.  I explained I am completing New OB Intake today. We discussed EDD of 04/14/24 based on LMP of 04/14/24. Pt is G2P1001. I reviewed her allergies, medications and Medical/Surgical/OB history.    Patient Active Problem List   Diagnosis Date Noted   Supervision of low-risk pregnancy 10/22/2023   History of gestational hypertension 10/22/2023    Concerns addressed today None  Delivery Plans Plans to deliver at [facility]. Discussed the nature of our practice with multiple providers including residents and students as well as female and female providers. Due to the size of the practice, the delivering provider may not be the same as those providing prenatal care.   Did not discuss waterbirth.  MyChart/Babyscripts MyChart access verified. I explained pt will have some visits in office and some virtually. Babyscripts instructions given and order placed.   Blood Pressure Cuff/Weight Scale Has BP cuff. Explained after first prenatal appt pt will check weekly and document in Babyscripts. Patient may document weight in Babyscripts if desired.  Anatomy US  Explained anatomy US  will be around 19 weeks or first available.  Is patient a CenteringPregnancy candidate?  Accepted; appts scheduled  Is patient a Mom+Baby Combined Care candidate?  Not a candidate; G2P1  Is patient a candidate for Babyscripts Optimization?  Prefers  Centering  First visit review I reviewed new OB appt with patient. Explained pt will be seen by Dr. Zina at first visit. Discussed [place] genetic screening with patient. Horizon previously negative. Desires Panorama. Will need routine labs with CMP, PR/CR ratio, and A1C.  Last Pap Diagnosis  Date Value Ref Range Status  02/07/2022   Final   - Negative for intraepithelial lesion or malignancy (NILM)   Vernell FORBES Ruddle, RN 11/05/2023  12:02 PM

## 2023-11-06 ENCOUNTER — Ambulatory Visit: Payer: Self-pay | Admitting: Obstetrics and Gynecology

## 2023-11-06 LAB — PROTEIN / CREATININE RATIO, URINE
Creatinine, Urine: 99.8 mg/dL
Protein, Ur: 7.3 mg/dL
Protein/Creat Ratio: 73 mg/g{creat} (ref 0–200)

## 2023-11-07 LAB — CBC/D/PLT+RPR+RH+ABO+RUBIGG...
Antibody Screen: NEGATIVE
Basophils Absolute: 0 x10E3/uL (ref 0.0–0.2)
Basos: 0 %
EOS (ABSOLUTE): 0.1 x10E3/uL (ref 0.0–0.4)
Eos: 1 %
HCV Ab: NONREACTIVE
HIV Screen 4th Generation wRfx: NONREACTIVE
Hematocrit: 32.6 % — ABNORMAL LOW (ref 34.0–46.6)
Hemoglobin: 10.9 g/dL — ABNORMAL LOW (ref 11.1–15.9)
Hepatitis B Surface Ag: NEGATIVE
Immature Grans (Abs): 0.1 x10E3/uL (ref 0.0–0.1)
Immature Granulocytes: 1 %
Lymphocytes Absolute: 1.9 x10E3/uL (ref 0.7–3.1)
Lymphs: 19 %
MCH: 32.4 pg (ref 26.6–33.0)
MCHC: 33.4 g/dL (ref 31.5–35.7)
MCV: 97 fL (ref 79–97)
Monocytes Absolute: 0.7 x10E3/uL (ref 0.1–0.9)
Monocytes: 7 %
Neutrophils Absolute: 7.4 x10E3/uL — ABNORMAL HIGH (ref 1.4–7.0)
Neutrophils: 72 %
Platelets: 218 x10E3/uL (ref 150–450)
RBC: 3.36 x10E6/uL — ABNORMAL LOW (ref 3.77–5.28)
RDW: 12.5 % (ref 11.7–15.4)
RPR Ser Ql: NONREACTIVE
Rh Factor: POSITIVE
Rubella Antibodies, IGG: 4.88 {index} (ref >0.99)
WBC: 10.2 x10E3/uL (ref 3.4–10.8)

## 2023-11-07 LAB — COMPREHENSIVE METABOLIC PANEL WITH GFR
ALT: 6 IU/L (ref 0–32)
AST: 15 IU/L (ref 0–40)
Albumin: 3.8 g/dL — ABNORMAL LOW (ref 4.0–5.0)
Alkaline Phosphatase: 45 IU/L (ref 44–121)
BUN/Creatinine Ratio: 12 (ref 9–23)
BUN: 8 mg/dL (ref 6–20)
Bilirubin Total: 0.3 mg/dL (ref 0.0–1.2)
CO2: 19 mmol/L — ABNORMAL LOW (ref 20–29)
Calcium: 9 mg/dL (ref 8.7–10.2)
Chloride: 103 mmol/L (ref 96–106)
Creatinine, Ser: 0.67 mg/dL (ref 0.57–1.00)
Globulin, Total: 2.5 g/dL (ref 1.5–4.5)
Glucose: 73 mg/dL (ref 70–99)
Potassium: 3.7 mmol/L (ref 3.5–5.2)
Sodium: 136 mmol/L (ref 134–144)
Total Protein: 6.3 g/dL (ref 6.0–8.5)
eGFR: 127 mL/min/1.73 (ref >59)

## 2023-11-07 LAB — AFP, SERUM, OPEN SPINA BIFIDA
AFP MoM: 1.02
AFP Value: 42.3 ng/mL
Gest. Age on Collection Date: 17 wk
Maternal Age At EDD: 23.3 a
OSBR Risk 1 IN: 10000
Test Results:: NEGATIVE
Weight: 152 [lb_av]

## 2023-11-07 LAB — HEMOGLOBIN A1C
Est. average glucose Bld gHb Est-mCnc: 80 mg/dL
Hgb A1c MFr Bld: 4.4 % — ABNORMAL LOW (ref 4.8–5.6)

## 2023-11-07 LAB — HCV INTERPRETATION

## 2023-11-11 LAB — CULTURE, OB URINE

## 2023-11-11 LAB — URINE CULTURE, OB REFLEX

## 2023-11-12 LAB — PANORAMA PRENATAL TEST FULL PANEL:PANORAMA TEST PLUS 5 ADDITIONAL MICRODELETIONS: FETAL FRACTION: 13.8

## 2023-11-26 ENCOUNTER — Encounter: Payer: Self-pay | Admitting: Family Medicine

## 2023-11-26 ENCOUNTER — Ambulatory Visit (INDEPENDENT_AMBULATORY_CARE_PROVIDER_SITE_OTHER): Admitting: Family Medicine

## 2023-11-26 ENCOUNTER — Other Ambulatory Visit (HOSPITAL_COMMUNITY)
Admission: RE | Admit: 2023-11-26 | Discharge: 2023-11-26 | Disposition: A | Discharge status: Home / Self Care | Source: Ambulatory Visit | Attending: Family Medicine | Admitting: Family Medicine

## 2023-11-26 ENCOUNTER — Other Ambulatory Visit: Payer: Self-pay

## 2023-11-26 VITALS — BP 109/66 | HR 85 | Wt 158.8 lb

## 2023-11-26 DIAGNOSIS — Z3492 Encounter for supervision of normal pregnancy, unspecified, second trimester: Secondary | ICD-10-CM | POA: Diagnosis present

## 2023-11-26 DIAGNOSIS — Z8759 Personal history of other complications of pregnancy, childbirth and the puerperium: Secondary | ICD-10-CM

## 2023-11-26 DIAGNOSIS — Z3A20 20 weeks gestation of pregnancy: Secondary | ICD-10-CM

## 2023-11-26 NOTE — Progress Notes (Signed)
     PRENATAL VISIT NOTE: Centering Pregnancy Group 23, Session 2   Subjective:  Jane Duke is a 23 y.o. G2P1001 at [redacted]w[redacted]d being seen today for ongoing prenatal care.  She is currently monitored for the following issues for this low-risk pregnancy and has Supervision of low-risk pregnancy and History of gestational hypertension on their problem list.  Patient reports no complaints.  Contractions: Not present. Vag. Bleeding: None.  Movement: Present. Denies leaking of fluid.   The following portions of the patient's history were reviewed and updated as appropriate: allergies, current medications, past family history, past medical history, past social history, past surgical history and problem list.   Objective:    Vitals:   11/26/23 0908  BP: 109/66  Pulse: 85  Weight: 158 lb 12.8 oz (72 kg)    Fetal Status:  Fetal Heart Rate (bpm): 164 Fundal Height: 20 cm Movement: Present    General: Alert, oriented and cooperative. Patient is in no acute distress.  Skin: Skin is warm and dry. No rash noted.   Cardiovascular: Normal heart rate noted  Respiratory: Normal respiratory effort, no problems with respiration noted  Abdomen: Soft, gravid, appropriate for gestational age.  Pain/Pressure: Present     Pelvic: Cervical exam deferred        Extremities: Normal range of motion.     Mental Status: Normal mood and affect. Normal behavior. Normal judgment and thought content.   Assessment and Plan:  Pregnancy: G2P1001 at [redacted]w[redacted]d 1. History of gestational hypertension (Primary) BP WNL today ASA prescribed and reports taking  2. Encounter for supervision of low-risk pregnancy in second trimester  Centering Pregnancy, Session#2: Reviewed rules for self-governance with group. Direct group to [employer].   Facilitated discussion today:  Nutrition, Back pain, vaginal discharge, UTI s/sx  Mindfulness activity completed - RAIN Activity  Fundal height and FHR appropriate today unless  noted otherwise in plan. Patient to continue group care.    Preterm labor symptoms and general obstetric precautions including but not limited to vaginal bleeding, contractions, leaking of fluid and fetal movement were reviewed in detail with the patient. Please refer to After Visit Summary for other counseling recommendations.   No follow-ups on file.  Future Appointments  Date Time Provider Department Center  12/09/2023  7:00 AM [facility] PROVIDER 1 [facility]  12/09/2023  7:30 AM [facility] US4 [facility]  12/24/2023  9:00 AM CENTERING PROVIDER [facility]  01/07/2024  9:00 AM CENTERING PROVIDER [facility]  01/21/2024  9:00 AM CENTERING PROVIDER [facility]  02/04/2024  9:00 AM CENTERING PROVIDER [facility]  02/18/2024  9:00 AM CENTERING PROVIDER [facility]  03/03/2024  9:00 AM CENTERING PROVIDER [facility]  03/17/2024  9:00 AM CENTERING PROVIDER [facility]  03/31/2024  9:00 AM CENTERING PROVIDER [facility]    Suzen Maryan Masters, MD

## 2023-11-27 ENCOUNTER — Ambulatory Visit: Payer: Self-pay | Admitting: Family Medicine

## 2023-11-27 LAB — CERVICOVAGINAL ANCILLARY ONLY
Chlamydia: NEGATIVE
Comment: NEGATIVE
Comment: NORMAL
Neisseria Gonorrhea: NEGATIVE

## 2023-12-09 ENCOUNTER — Ambulatory Visit (HOSPITAL_BASED_OUTPATIENT_CLINIC_OR_DEPARTMENT_OTHER): Admitting: Obstetrics

## 2023-12-09 ENCOUNTER — Other Ambulatory Visit: Payer: Self-pay | Admitting: Obstetrics and Gynecology

## 2023-12-09 ENCOUNTER — Ambulatory Visit: Attending: Obstetrics and Gynecology

## 2023-12-09 VITALS — BP 115/63

## 2023-12-09 DIAGNOSIS — Z3490 Encounter for supervision of normal pregnancy, unspecified, unspecified trimester: Secondary | ICD-10-CM

## 2023-12-09 DIAGNOSIS — Z3689 Encounter for other specified antenatal screening: Secondary | ICD-10-CM | POA: Insufficient documentation

## 2023-12-09 DIAGNOSIS — Z3A21 21 weeks gestation of pregnancy: Secondary | ICD-10-CM

## 2023-12-09 DIAGNOSIS — O09292 Supervision of pregnancy with other poor reproductive or obstetric history, second trimester: Secondary | ICD-10-CM | POA: Diagnosis not present

## 2023-12-09 DIAGNOSIS — O321XX0 Maternal care for breech presentation, not applicable or unspecified: Secondary | ICD-10-CM | POA: Diagnosis not present

## 2023-12-09 DIAGNOSIS — Z3492 Encounter for supervision of normal pregnancy, unspecified, second trimester: Secondary | ICD-10-CM

## 2023-12-09 DIAGNOSIS — O1492 Unspecified pre-eclampsia, second trimester: Secondary | ICD-10-CM | POA: Insufficient documentation

## 2023-12-09 DIAGNOSIS — Z8759 Personal history of other complications of pregnancy, childbirth and the puerperium: Secondary | ICD-10-CM

## 2023-12-09 DIAGNOSIS — O358XX0 Maternal care for other (suspected) fetal abnormality and damage, not applicable or unspecified: Secondary | ICD-10-CM | POA: Insufficient documentation

## 2023-12-09 NOTE — Progress Notes (Signed)
 MFM Consult Note  Jane Duke is currently at 21 weeks and 6 days.  She was seen for a detailed fetal anatomy scan.  She has a history of gestational hypertension in her prior pregnancy.  She denies any problems in her current pregnancy.  Her blood pressure today was 115/63.  She had a cell free DNA test earlier in her pregnancy which indicated a low risk for trisomy 17, 42, and 13. A female fetus is predicted.   Sonographic findings Single intrauterine pregnancy at 21w 6d. Fetal cardiac activity:  Observed and appears normal. Presentation: Breech. The anatomic structures that were well seen appear normal without evidence of soft markers. The anatomic survey is complete.  Fetal biometry shows the estimated fetal weight at the 80th percentile. Amniotic fluid: Within normal limits.  MVP: 4.45 cm. Placenta: Anterior. Adnexa: No abnormality visualized. Cervical length: 4.1 cm.  The patient was informed that anomalies may be missed due to technical limitations. If the fetus is in a suboptimal position or maternal habitus is increased, visualization of the fetus in the maternal uterus may be impaired.  Due to her history of gestational hypertension in her prior pregnancy, she should continue taking a daily baby aspirin  for preeclampsia prophylaxis.  The increased risk of preeclampsia should she develop gestational hypertension again in her current pregnancy was discussed.    As the views of the fetal anatomy were visualized today and there were no obvious fetal anomalies noted, no further exams were scheduled in our office.    She should be referred back for another ultrasound should she develop gestational hypertension again.    The patient stated that all of her questions were answered.  A total of 30 minutes was spent counseling and coordinating the care for this patient.  Greater than 50% of the time was spent in direct face-to-face contact.

## 2023-12-24 ENCOUNTER — Other Ambulatory Visit: Payer: Self-pay

## 2023-12-24 ENCOUNTER — Ambulatory Visit (INDEPENDENT_AMBULATORY_CARE_PROVIDER_SITE_OTHER): Payer: Self-pay | Admitting: Family Medicine

## 2023-12-24 VITALS — BP 114/67 | HR 86 | Wt 161.4 lb

## 2023-12-24 DIAGNOSIS — Z3492 Encounter for supervision of normal pregnancy, unspecified, second trimester: Secondary | ICD-10-CM

## 2023-12-24 DIAGNOSIS — Z8759 Personal history of other complications of pregnancy, childbirth and the puerperium: Secondary | ICD-10-CM

## 2023-12-24 DIAGNOSIS — Z3A24 24 weeks gestation of pregnancy: Secondary | ICD-10-CM

## 2023-12-24 NOTE — Progress Notes (Unsigned)
   PRENATAL VISIT NOTE  Subjective:  Jane Duke is a 23 y.o. G2P1001 at [redacted]w[redacted]d being seen today for ongoing prenatal care.  She is currently monitored for the following issues for this {Blank single:19197::high-risk,low-risk} pregnancy and has Supervision of low-risk pregnancy and History of gestational hypertension on their problem list.  Patient reports {sx:14538}.   .  .   . Denies leaking of fluid.   The following portions of the patient's history were reviewed and updated as appropriate: allergies, current medications, past family history, past medical history, past social history, past surgical history and problem list.   Objective:    There were no vitals filed for this visit.  Fetal Status:           General: Alert, oriented and cooperative. Patient is in no acute distress.  Skin: Skin is warm and dry. No rash noted.   Cardiovascular: Normal heart rate noted  Respiratory: Normal respiratory effort, no problems with respiration noted  Abdomen: Soft, gravid, appropriate for gestational age.        Pelvic: {Blank single:19197::Cervical exam performed in the presence of a chaperone,Cervical exam deferred}        Extremities: Normal range of motion.     Mental Status: Normal mood and affect. Normal behavior. Normal judgment and thought content.   Assessment and Plan:  Pregnancy: G2P1001 at [redacted]w[redacted]d 1. History of gestational hypertension (Primary) ***  2. Encounter for supervision of low-risk pregnancy in second trimester ***  {Blank single:19197::Term,Preterm} labor symptoms and general obstetric precautions including but not limited to vaginal bleeding, contractions, leaking of fluid and fetal movement were reviewed in detail with the patient. Please refer to After Visit Summary for other counseling recommendations.   No follow-ups on file.  Future Appointments  Date Time Provider Department Center  12/24/2023  9:00 AM [place] Suzen Octave, MD [facility]   01/07/2024  9:00 AM CENTERING PROVIDER [facility]  01/21/2024  9:00 AM CENTERING PROVIDER [facility]  02/04/2024  9:00 AM CENTERING PROVIDER [facility]  02/18/2024  9:00 AM CENTERING PROVIDER [facility]  03/03/2024  9:00 AM CENTERING PROVIDER [facility]  03/17/2024  9:00 AM CENTERING PROVIDER [facility]  03/31/2024  9:00 AM CENTERING PROVIDER [facility]    Suzen Octave Eldonna, MD

## 2024-01-07 ENCOUNTER — Ambulatory Visit (INDEPENDENT_AMBULATORY_CARE_PROVIDER_SITE_OTHER): Payer: Self-pay | Admitting: Family Medicine

## 2024-01-07 ENCOUNTER — Other Ambulatory Visit: Payer: Self-pay

## 2024-01-07 ENCOUNTER — Other Ambulatory Visit

## 2024-01-07 VITALS — BP 117/69 | HR 96 | Wt 164.6 lb

## 2024-01-07 DIAGNOSIS — Z3493 Encounter for supervision of normal pregnancy, unspecified, third trimester: Secondary | ICD-10-CM

## 2024-01-07 DIAGNOSIS — Z1332 Encounter for screening for maternal depression: Secondary | ICD-10-CM | POA: Diagnosis not present

## 2024-01-07 DIAGNOSIS — Z8759 Personal history of other complications of pregnancy, childbirth and the puerperium: Secondary | ICD-10-CM | POA: Diagnosis not present

## 2024-01-07 DIAGNOSIS — Z3492 Encounter for supervision of normal pregnancy, unspecified, second trimester: Secondary | ICD-10-CM

## 2024-01-07 NOTE — Progress Notes (Addendum)
 PRENATAL VISIT NOTE: Centering Pregnancy Group 23, Session 4   Subjective:  Jane Duke is a 23 y.o. G2P1001 at 110w0d being seen today for ongoing prenatal care.  She is currently monitored for the following issues for this low-risk pregnancy and has Supervision of low-risk pregnancy and History of gestational hypertension on their problem list.  Patient reports no complaints.  Contractions: Not present. Vag. Bleeding: None.  Movement: Present. Denies leaking of fluid.   The following portions of the patient's history were reviewed and updated as appropriate: allergies, current medications, past family history, past medical history, past social history, past surgical history and problem list.   Objective:   Vitals:   01/07/24 0900  BP: 117/69  Pulse: 96  Weight: 164 lb 9.6 oz (74.7 kg)    Fetal Status:  Fetal Heart Rate (bpm): 159 Fundal Height: 28 cm Movement: Present    General: Alert, oriented and cooperative. Patient is in no acute distress.  Skin: Skin is warm and dry. No rash noted.   Cardiovascular: Normal heart rate noted  Respiratory: Normal respiratory effort, no problems with respiration noted  Abdomen: Soft, gravid, appropriate for gestational age.  Pain/Pressure: Absent     Pelvic: Cervical exam deferred        Extremities: Normal range of motion.     Mental Status: Normal mood and affect. Normal behavior. Normal judgment and thought content.      01/07/2024   11:21 AM 11/05/2023    4:31 PM 12/17/2021    2:57 PM  Depression screen PHQ 2/9  Decreased Interest 0 0 0  Down, Depressed, Hopeless 0 0 0  PHQ - 2 Score 0 0 0  Altered sleeping 1 0 1  Tired, decreased energy 0 0 0  Change in appetite 0 0 1  Feeling bad or failure about yourself  0 0 0  Trouble concentrating 0 0 0  Moving slowly or fidgety/restless 0 0 0  Suicidal thoughts 0 0 0  PHQ-9 Score 1 0 2        01/07/2024   11:21 AM 11/05/2023    4:31 PM 12/17/2021    2:57 PM 10/04/2021   10:44 AM   GAD 7 : Generalized Anxiety Score  Nervous, Anxious, on Edge 0 0 1 1  Control/stop worrying 0 0 1 0  Worry too much - different things 0 0 0 0  Trouble relaxing 0 1 0 0  Restless 0 0 0 1  Easily annoyed or irritable 0 1 1 1   Afraid - awful might happen 0 0 0 0  Total GAD 7 Score 0 2 3 3     Assessment and Plan:  Pregnancy: G2P1001 at [redacted]w[redacted]d 1. Encounter for supervision of low-risk pregnancy in third trimester (Primary)  Centering Pregnancy, Session#4: Reviewed resources in [employer].   Facilitated discussion today:  follow up breastfeeding/infant feeding discussion with IBCLC, postpartum resources.   Fundal height and FHR appropriate today unless noted otherwise in plan. Patient to continue group care.  Completing 3rd trimester labs today Depression and anxiety screening was negative  Patient expressed interest in WB-- placed instructions on AVS today - Pt is interested in waterbirth.  No contraindications at this time per chart review/patient assessment.   - Pt to enroll in class, see CNMs for most visits in the office.  - Discussed waterbirth as option for low-risk pregnancy.  Reviewed conditions that may arise during pregnancy that will risk pt out of waterbirth including hypertension, diabetes, fetal growth  restriction <10%ile, etc.   2. History of gestational hypertension On ASA  Preterm labor symptoms and general obstetric precautions including but not limited to vaginal bleeding, contractions, leaking of fluid and fetal movement were reviewed in detail with the patient. Please refer to After Visit Summary for other counseling recommendations.   Return in about 2 weeks (around 01/21/2024) for Centering Pregnancy.  Future Appointments  Date Time Provider Department Center  01/21/2024  9:00 AM CENTERING PROVIDER [facility]  02/04/2024  9:00 AM CENTERING PROVIDER [facility]  02/18/2024  9:00 AM CENTERING PROVIDER [facility]  03/03/2024  9:00 AM CENTERING PROVIDER  [facility]  03/17/2024  9:00 AM CENTERING PROVIDER [facility]  03/31/2024  9:00 AM CENTERING PROVIDER [facility]    Suzen Maryan Masters, MD

## 2024-01-07 NOTE — Patient Instructions (Signed)
   Considering Waterbirth? Guide for patients at [facility]) Why consider waterbirth? Gentle birth for babies  Less pain medicine used in labor  May allow for passive descent/less pushing  May reduce perineal tears  More mobility and instinctive maternal position changes  Increased maternal relaxation   Is waterbirth safe? What are the risks of infection, drowning or other complications? Infection:  Very low risk (3.7 % for tub vs 4.8% for bed)  7 in 8000 waterbirths with documented infection  Poorly cleaned equipment most common cause  Slightly lower group B strep transmission rate  Drowning  Maternal:  Very low risk  Related to seizures or fainting  Newborn:  Very low risk. No evidence of increased risk of respiratory problems in multiple large studies  Physiological protection from breathing under water  Avoid underwater birth if there are any fetal complications  Once baby's head is out of the water, keep it out.  Birth complication  Some reports of cord trauma, but risk decreased by bringing baby to surface gradually  No evidence of increased risk of shoulder dystocia. Mothers can usually change positions faster in water than in a bed, possibly aiding the maneuvers to free the shoulder.   There are 2 things you MUST do to have a waterbirth with [facility]: Attend a waterbirth class at [facility]   3rd Wednesday of every month from 7-9 pm (virtual during COVID) [employer] at [email or web address] or [email or web address] or by calling [phone] Bring us  the certificate from the class to your prenatal appointment or send via MyChart Meet with a midwife at 36 weeks* to see if you can still plan a waterbirth and to sign the consent.   *We also recommend that you schedule as many of your prenatal visits with a midwife as possible.    Helpful information: You may want to bring a bathing suit top to the hospital  to wear during labor but this is optional.  All other supplies are provided by the hospital. Please arrive at the hospital with signs of active labor, and do not wait at home until late in labor. It takes 45 min- 1 hour for fetal monitoring, and check in to your room to take place, plus transport and filling of the waterbirth tub.    Things that would prevent you from having a waterbirth: Premature, <37wks  Previous cesarean birth  Presence of thick meconium-stained fluid  Multiple gestation (Twins, triplets, etc.)  Uncontrolled diabetes or gestational diabetes requiring medication  Hypertension diagnosed in pregnancy or preexisting hypertension (gestational hypertension, preeclampsia, or chronic hypertension) Fetal growth restriction (your baby measures less than 10th percentile on ultrasound) Heavy vaginal bleeding  Non-reassuring fetal heart rate  Active infection (MRSA, etc.). Group B Strep is NOT a contraindication for waterbirth.  If your labor has to be induced and induction method requires continuous monitoring of the baby's heart rate  Other risks/issues identified by your obstetrical provider   Please remember that birth is unpredictable. Under certain unforeseeable circumstances your provider may advise against giving birth in the tub. These decisions will be made on a case-by-case basis and with the safety of you and your baby as our highest priority.    Updated 06/06/21

## 2024-01-08 LAB — GLUCOSE TOLERANCE, 2 HOURS W/ 1HR
Glucose, 1 hour: 100 mg/dL (ref 70–179)
Glucose, 2 hour: 92 mg/dL (ref 70–152)
Glucose, Fasting: 77 mg/dL (ref 70–91)

## 2024-01-08 LAB — CBC
Hematocrit: 32 % — ABNORMAL LOW (ref 34.0–46.6)
Hemoglobin: 10.5 g/dL — ABNORMAL LOW (ref 11.1–15.9)
MCH: 32.2 pg (ref 26.6–33.0)
MCHC: 32.8 g/dL (ref 31.5–35.7)
MCV: 98 fL — ABNORMAL HIGH (ref 79–97)
Platelets: 187 x10E3/uL (ref 150–450)
RBC: 3.26 x10E6/uL — ABNORMAL LOW (ref 3.77–5.28)
RDW: 12.3 % (ref 11.7–15.4)
WBC: 10.6 x10E3/uL (ref 3.4–10.8)

## 2024-01-08 LAB — HIV ANTIBODY (ROUTINE TESTING W REFLEX): HIV Screen 4th Generation wRfx: NONREACTIVE

## 2024-01-08 LAB — RPR: RPR Ser Ql: NONREACTIVE

## 2024-01-09 ENCOUNTER — Ambulatory Visit: Payer: Self-pay | Admitting: Family Medicine

## 2024-01-09 DIAGNOSIS — O99013 Anemia complicating pregnancy, third trimester: Secondary | ICD-10-CM | POA: Insufficient documentation

## 2024-01-09 MED ORDER — FERROUS SULFATE 325 (65 FE) MG PO TBEC: 325.0000 mg | DELAYED_RELEASE_TABLET | ORAL | 3 refills | Status: AC

## 2024-01-20 NOTE — Patient Instructions (Incomplete)
[email or web address]  

## 2024-01-21 ENCOUNTER — Ambulatory Visit (INDEPENDENT_AMBULATORY_CARE_PROVIDER_SITE_OTHER): Payer: Self-pay | Admitting: Advanced Practice Midwife

## 2024-01-21 VITALS — BP 121/68 | HR 94 | Wt 171.8 lb

## 2024-01-21 DIAGNOSIS — O99013 Anemia complicating pregnancy, third trimester: Secondary | ICD-10-CM | POA: Diagnosis not present

## 2024-01-21 DIAGNOSIS — Z3493 Encounter for supervision of normal pregnancy, unspecified, third trimester: Secondary | ICD-10-CM

## 2024-01-21 DIAGNOSIS — Z8759 Personal history of other complications of pregnancy, childbirth and the puerperium: Secondary | ICD-10-CM

## 2024-01-21 DIAGNOSIS — Z23 Encounter for immunization: Secondary | ICD-10-CM

## 2024-01-21 DIAGNOSIS — Z3A28 28 weeks gestation of pregnancy: Secondary | ICD-10-CM

## 2024-01-21 NOTE — Progress Notes (Signed)
   PRENATAL VISIT NOTE  Subjective:  Jane Duke is a 23 y.o. G2P1001 at [redacted]w[redacted]d being seen today for ongoing prenatal care.  She is currently monitored for the following issues for this low-risk pregnancy and has Supervision of low-risk pregnancy; History of gestational hypertension; and Anemia in pregnancy, third trimester on their problem list.   Patient reports no complaints.  Contractions: Not present. Vag. Bleeding: None.  Movement: Present. Denies leaking of fluid.   The following portions of the patient's history were reviewed and updated as appropriate: allergies, current medications, past family history, past medical history, past social history, past surgical history and problem list.   Objective:   Vitals:   01/21/24 0941  BP: 121/68  Pulse: 94  Weight: 171 lb 12.8 oz (77.9 kg)    Fetal Status: Fetal Heart Rate (bpm): 145 Fundal Height: 29 cm Movement: Present     General:  Alert, oriented and cooperative. Patient is in no acute distress.  Skin: Skin is warm and dry. No rash noted.   Cardiovascular: Normal heart rate noted  Respiratory: Normal respiratory effort, no problems with respiration noted  Abdomen: Soft, gravid, appropriate for gestational age.  Pain/Pressure: Absent     Pelvic: Cervical exam deferred        Extremities: Normal range of motion.  Edema: None  Mental Status: Normal mood and affect. Normal behavior. Normal judgment and thought content.   Assessment and Plan:  Pregnancy: G2P1001 at [redacted]w[redacted]d 1. Encounter for supervision of low-risk pregnancy in third trimester (Primary) - Pt is interested in waterbirth.  No contraindications at this time per chart review/patient assessment.   - Pt to enroll in class, see CNMs for most visits in the office.  - Discussed waterbirth as option for low-risk pregnancy.  Reviewed conditions that may arise during pregnancy that will risk pt out of waterbirth including hypertension, diabetes, fetal growth restriction <10%ile,  etc.  2. History of gestational hypertension - Nml today 3. Anemia in pregnancy, third trimester  4. [redacted] weeks gestation of pregnancy - GTT Nml - TDaP   Centering Pregnancy, Session#7: Reviewed resources in [employer].   Facilitated discussion today:   - Preparing for labor - Stages of labor - Mindfulness activity - Encouraged to choose pediatrician and support people/doula  Fundal height and FHR appropriate today unless noted otherwise in plan. Patient to continue group care.    Preterm labor symptoms and general obstetric precautions including but not limited to vaginal bleeding, contractions, leaking of fluid and fetal movement were reviewed in detail with the patient. Please refer to After Visit Summary for other counseling recommendations.   Return in about 2 weeks (around 02/04/2024) for CenteringPregnancy as scheduled.  Future Appointments  Date Time Provider Department Center  02/04/2024  9:00 AM CENTERING PROVIDER [facility]  02/18/2024  9:00 AM CENTERING PROVIDER [facility]  03/03/2024  9:00 AM CENTERING PROVIDER [facility]  03/17/2024  9:00 AM CENTERING PROVIDER [facility]  03/31/2024  9:00 AM CENTERING PROVIDER [facility]    Marcelene Weidemann  Claudene HOWARD [facility] for [employer]

## 2024-02-03 NOTE — Progress Notes (Unsigned)
 PRENATAL VISIT NOTE: Centering Pregnancy Group 23, Session 6   Subjective:  Jane Duke is a 23 y.o. G2P1001 at [redacted]w[redacted]d being seen today for ongoing prenatal care.  She is currently monitored for the following issues for this low-risk pregnancy and has Supervision of low-risk pregnancy; History of gestational hypertension; and Anemia in pregnancy, third trimester on their problem list.  Patient reports no complaints.  Contractions: Not present. Vag. Bleeding: None.  Movement: Present. Denies leaking of fluid.   The following portions of the patient's history were reviewed and updated as appropriate: allergies, current medications, past family history, past medical history, past social history, past surgical history and problem list.   Objective:   Vitals:   02/04/24 0917  BP: 119/68  Pulse: 98  Weight: 168 lb (76.2 kg)    Fetal Status:  Fetal Heart Rate (bpm): 143 Fundal Height: 30 cm Movement: Present    General: Alert, oriented and cooperative. Patient is in no acute distress.  Skin: Skin is warm and dry. No rash noted.   Cardiovascular: Normal heart rate noted  Respiratory: Normal respiratory effort, no problems with respiration noted  Abdomen: Soft, gravid, appropriate for gestational age.  Pain/Pressure: Absent     Pelvic: Cervical exam deferred        Extremities: Normal range of motion.     Mental Status: Normal mood and affect. Normal behavior. Normal judgment and thought content.      01/07/2024   11:21 AM 11/05/2023    4:31 PM 12/17/2021    2:57 PM  Depression screen PHQ 2/9  Decreased Interest 0 0 0  Down, Depressed, Hopeless 0 0 0  PHQ - 2 Score 0 0 0  Altered sleeping 1 0 1  Tired, decreased energy 0 0 0  Change in appetite 0 0 1  Feeling bad or failure about yourself  0 0 0  Trouble concentrating 0 0 0  Moving slowly or fidgety/restless 0 0 0  Suicidal thoughts 0 0 0  PHQ-9 Score 1  0  2      Data saved with a previous flowsheet row definition         01/07/2024   11:21 AM 11/05/2023    4:31 PM 12/17/2021    2:57 PM 10/04/2021   10:44 AM  GAD 7 : Generalized Anxiety Score  Nervous, Anxious, on Edge 0 0 1 1  Control/stop worrying 0 0 1 0  Worry too much - different things 0 0 0 0  Trouble relaxing 0 1 0 0  Restless 0 0 0 1  Easily annoyed or irritable 0 1 1 1   Afraid - awful might happen 0 0 0 0  Total GAD 7 Score 0 2 3 3     Assessment and Plan:  Pregnancy: G2P1001 at [redacted]w[redacted]d 1. [redacted] weeks gestation of pregnancy   2. Encounter for supervision of low-risk pregnancy in third trimester (Primary)   Centering Pregnancy, Session#6: Reviewed resources in [employer].   Facilitated discussion today:  Postpartum care/what's in the basket  Fundal height and FHR appropriate today unless noted otherwise in plan. Patient to continue group care.   Completed 3rd trimester labs, WNL Plans on WB class next week, partner/boyfriend and his mother will attend as well  3. History of gestational hypertension BP WNL, stable  4. Anemia in pregnancy, third trimester Lab Results  Component Value Date   HGB 10.5 (L) 01/07/2024   HGB 10.9 (L) 11/05/2023   HGB 9.0 (L) 12/21/2021  Continue Fe  Preterm labor symptoms and general obstetric precautions including but not limited to vaginal bleeding, contractions, leaking of fluid and fetal movement were reviewed in detail with the patient. Please refer to After Visit Summary for other counseling recommendations.   Return in about 2 weeks (around 02/18/2024) for Centering Pregnancy.  Future Appointments  Date Time Provider Department Center  02/18/2024  9:00 AM CENTERING PROVIDER [facility]  03/03/2024  9:00 AM CENTERING PROVIDER [facility]  03/17/2024  9:00 AM CENTERING PROVIDER [facility]  03/31/2024  9:00 AM CENTERING PROVIDER [facility]    Suzen Maryan Masters, MD

## 2024-02-04 ENCOUNTER — Ambulatory Visit: Payer: Self-pay

## 2024-02-04 ENCOUNTER — Other Ambulatory Visit: Payer: Self-pay

## 2024-02-04 VITALS — BP 119/68 | HR 98 | Wt 168.0 lb

## 2024-02-04 DIAGNOSIS — Z8759 Personal history of other complications of pregnancy, childbirth and the puerperium: Secondary | ICD-10-CM

## 2024-02-04 DIAGNOSIS — O99013 Anemia complicating pregnancy, third trimester: Secondary | ICD-10-CM | POA: Diagnosis not present

## 2024-02-04 DIAGNOSIS — Z3A29 29 weeks gestation of pregnancy: Secondary | ICD-10-CM

## 2024-02-04 DIAGNOSIS — Z3493 Encounter for supervision of normal pregnancy, unspecified, third trimester: Secondary | ICD-10-CM

## 2024-02-10 ENCOUNTER — Encounter (HOSPITAL_COMMUNITY): Payer: Self-pay | Admitting: Obstetrics & Gynecology

## 2024-02-10 ENCOUNTER — Inpatient Hospital Stay (HOSPITAL_COMMUNITY)
Admission: AD | Admit: 2024-02-10 | Discharge: 2024-02-10 | Disposition: A | Discharge status: Home / Self Care | Attending: Obstetrics & Gynecology | Admitting: Obstetrics & Gynecology

## 2024-02-10 DIAGNOSIS — H1133 Conjunctival hemorrhage, bilateral: Secondary | ICD-10-CM

## 2024-02-10 DIAGNOSIS — Z3A30 30 weeks gestation of pregnancy: Secondary | ICD-10-CM

## 2024-02-10 DIAGNOSIS — Z3689 Encounter for other specified antenatal screening: Secondary | ICD-10-CM

## 2024-02-10 NOTE — MAU Provider Note (Signed)
 History     CSN: [id number]  Arrival date and time: 02/10/24 0910   Event Date/Time   First Provider Initiated Contact with Patient 02/10/24 1015      Chief Complaint  Patient presents with   Decreased Fetal Movement   Eye Problem   HPI Jane Duke is a 24 y.o. year old G66P1001 female at [redacted]w[redacted]d weeks gestation who presents to MAU reporting she had a blood vessel in her RT eye to burst 3 days ago and then one in the LT eye to burst yesterday. She reports some cramping and DFM since yesterday. She rates the pain from cramping 2/10. She mentioned having intermittent H/As, but none today. She denies any N/V, straining with BM, coughing, sneezing. She does, however, report picking up her daughter who weighs over 30 lbs throughout the day. She receives [facility] with [facility]; next appt is 02/18/2024.    OB History     Gravida  2   Para  1   Term  1   Preterm  0   AB  0   Living  1      SAB  0   IAB  0   Ectopic  0   Multiple  0   Live Births  1           Past Medical History:  Diagnosis Date   Medical history non-contributory     Past Surgical History:  Procedure Laterality Date   NO PAST SURGERIES      Family History  Problem Relation Age of Onset   Hypertension Mother    Hypertension Maternal Grandmother    Kidney failure Maternal Grandmother    Diabetes Maternal Grandmother    Asthma Neg Hx    Cancer Neg Hx    Heart disease Neg Hx     Social History   Tobacco Use   Smoking status: Former    Types: Cigars   Smokeless tobacco: Never  Vaping Use   Vaping status: Never Used  Substance Use Topics   Alcohol use: Not Currently    Comment: last alcohol intake 2024   Drug use: Not Currently    Types: Marijuana    Comment: last use sep 2025    Allergies: No Known Allergies  Medications Prior to Admission  Medication Sig Dispense Refill Last Dose/Taking   aspirin  81 MG chewable tablet Chew 1 tablet (81 mg total) by mouth daily. 90 tablet 2  02/09/2024   ferrous sulfate  325 (65 FE) MG EC tablet Take 1 tablet (325 mg total) by mouth every other day. 30 tablet 3 02/09/2024   Prenatal Vit-Fe Fumarate-FA (MULTIVITAMIN-PRENATAL) 27-0.8 MG TABS tablet Take 1 tablet by mouth daily at 12 noon.   02/09/2024    Review of Systems  Constitutional: Negative.   HENT: Negative.    Eyes:        Blood vessel in RT & LT eye burst  Respiratory: Negative.    Cardiovascular: Negative.   Gastrointestinal: Negative.   Endocrine: Negative.   Genitourinary: Negative.   Musculoskeletal: Negative.   Skin: Negative.   Allergic/Immunologic: Negative.   Neurological: Negative.   Hematological: Negative.   Psychiatric/Behavioral: Negative.     Physical Exam   Blood pressure 115/70, pulse 87, temperature 98.8 F (37.1 C), resp. rate 18, height 5' 5 (1.651 m), weight 75.3 kg, last menstrual period 07/09/2023.  Physical Exam Vitals and nursing note reviewed.  Constitutional:      Appearance: Normal appearance. She is normal weight.  HENT:     Head: Normocephalic and atraumatic.  Eyes:     Extraocular Movements: Extraocular movements intact.     Conjunctiva/sclera:     Right eye: Hemorrhage present.     Left eye: Hemorrhage present.     Pupils: Pupils are equal, round, and reactive to light.   Cardiovascular:     Rate and Rhythm: Normal rate.  Pulmonary:     Effort: Pulmonary effort is normal.  Abdominal:     Comments: gravid  Genitourinary:    Comments: Not indicated Musculoskeletal:        General: Normal range of motion.  Skin:    General: Skin is warm and dry.  Neurological:     Mental Status: She is alert and oriented to person, place, and time.  Psychiatric:        Mood and Affect: Mood normal.        Behavior: Behavior normal.        Thought Content: Thought content normal.        Judgment: Judgment normal.    REACTIVE NST - FHR: 145 bpm / moderate variability / accels present / decels absent / TOCO: none MAU Course   Procedures  MDM EFM   Assessment and Plan  1. NST (non-stress test) reactive (Primary) - Reviewed normal FHR tracing with patient - Patient using event marker to document her perception of FM.  2. Conjunctival hemorrhage of both eyes - Information provided on conjunctival hemorrhage - Advised that picking up her over 30 lbs daughter could be the cause of her straining unknowingly. - Advised she should not be picking up anything heavier than 25 lbs at this point in pregnancy. - Advised to have daughter get on couch or a stool so she doesn't have to strain or bend over to pick her up   3. [redacted] weeks gestation of pregnancy  - Discharge home - Keep scheduled appt with [facility] on 02/18/2024 - Patient verbalized an understanding of the plan of care and agrees.   Ala Cart, CNM 02/10/2024, 10:19 AM

## 2024-02-10 NOTE — MAU Note (Signed)
 Jane Duke is a 23 y.o. at [redacted]w[redacted]d here in MAU reporting: 3 days ago had a blood vessel in her  right eye burst. Staed since then baby has not moved much. Yesterday she had a blood vessel burst in the left ey. Reports some cramping . Has had headaches off and on none today   LMP:  Onset of complaint: 3 days  Pain score: 2  Vitals:   02/10/24 0925  BP: 128/71  Pulse: 83  Resp: 18  Temp: 98.8 F (37.1 C)     FHT: 153   Lab orders placed from triage:

## 2024-02-18 ENCOUNTER — Other Ambulatory Visit: Payer: Self-pay

## 2024-02-18 ENCOUNTER — Ambulatory Visit (INDEPENDENT_AMBULATORY_CARE_PROVIDER_SITE_OTHER): Payer: Self-pay | Admitting: Family Medicine

## 2024-02-18 VITALS — BP 116/65 | HR 83 | Wt 164.0 lb

## 2024-02-18 DIAGNOSIS — O99013 Anemia complicating pregnancy, third trimester: Secondary | ICD-10-CM

## 2024-02-18 DIAGNOSIS — Z3A32 32 weeks gestation of pregnancy: Secondary | ICD-10-CM

## 2024-02-18 DIAGNOSIS — Z3493 Encounter for supervision of normal pregnancy, unspecified, third trimester: Secondary | ICD-10-CM

## 2024-02-18 DIAGNOSIS — Z8759 Personal history of other complications of pregnancy, childbirth and the puerperium: Secondary | ICD-10-CM

## 2024-02-18 NOTE — Progress Notes (Signed)
 PRENATAL VISIT NOTE: Centering Pregnancy Group 23, Session 7   Subjective:  Jane Duke is a 23 y.o. G2P1001 at [redacted]w[redacted]d being seen today for ongoing prenatal care.  She is currently monitored for the following issues for this low-risk pregnancy and has Supervision of low-risk pregnancy; History of gestational hypertension; and Anemia in pregnancy, third trimester on their problem list.  Patient reports no complaints.  Contractions: Not present. Vag. Bleeding: None.  Movement: Present. Denies leaking of fluid.   The following portions of the patient's history were reviewed and updated as appropriate: allergies, current medications, past family history, past medical history, past social history, past surgical history and problem list.   Objective:   Vitals:   02/18/24 0907  BP: 116/65  Pulse: 83  Weight: 164 lb (74.4 kg)    Fetal Status:  Fetal Heart Rate (bpm): 146 Fundal Height: 32 cm Movement: Present    General: Alert, oriented and cooperative. Patient is in no acute distress.  Skin: Skin is warm and dry. No rash noted.   Cardiovascular: Normal heart rate noted  Respiratory: Normal respiratory effort, no problems with respiration noted  Abdomen: Soft, gravid, appropriate for gestational age.  Pain/Pressure: Absent     Pelvic: Cervical exam deferred        Extremities: Normal range of motion.     Mental Status: Normal mood and affect. Normal behavior. Normal judgment and thought content.      01/07/2024   11:21 AM 11/05/2023    4:31 PM 12/17/2021    2:57 PM  Depression screen PHQ 2/9  Decreased Interest 0 0 0  Down, Depressed, Hopeless 0 0 0  PHQ - 2 Score 0 0 0  Altered sleeping 1 0 1  Tired, decreased energy 0 0 0  Change in appetite 0 0 1  Feeling bad or failure about yourself  0 0 0  Trouble concentrating 0 0 0  Moving slowly or fidgety/restless 0 0 0  Suicidal thoughts 0 0 0  PHQ-9 Score 1  0  2      Data saved with a previous flowsheet row definition         01/07/2024   11:21 AM 11/05/2023    4:31 PM 12/17/2021    2:57 PM 10/04/2021   10:44 AM  GAD 7 : Generalized Anxiety Score  Nervous, Anxious, on Edge 0 0 1 1  Control/stop worrying 0 0 1 0  Worry too much - different things 0 0 0 0  Trouble relaxing 0 1 0 0  Restless 0 0 0 1  Easily annoyed or irritable 0 1 1 1   Afraid - awful might happen 0 0 0 0  Total GAD 7 Score 0 2 3 3     Assessment and Plan:  Pregnancy: G2P1001 at [redacted]w[redacted]d 1. [redacted] weeks gestation of pregnancy  2. Encounter for supervision of low-risk pregnancy in third trimester (Primary)  Centering Pregnancy, Session#7: Reviewed resources in [employer].  Facilitated discussion today: family planning/reproductive life planning, coping strategies   Mindfulness activity with positive affirmations   Fundal height and FHR appropriate today unless noted otherwise in plan. Patient to continue group care.    Desiring Ju[facility] Takes class next week No medical contraindications for immersion  3. Anemia in pregnancy, third trimester Lab Results  Component Value Date   HGB 10.5 (L) 01/07/2024   HGB 10.9 (L) 11/05/2023  Stable.   4. History of gestational hypertension BP WNL  Preterm labor symptoms and general obstetric  precautions including but not limited to vaginal bleeding, contractions, leaking of fluid and fetal movement were reviewed in detail with the patient. Please refer to After Visit Summary for other counseling recommendations.   No follow-ups on file.  Future Appointments  Date Time Provider Department Center  03/03/2024  9:00 AM CENTERING PROVIDER [facility]  03/17/2024  9:00 AM CENTERING PROVIDER [facility]  03/31/2024  9:00 AM CENTERING PROVIDER [facility]    Suzen Maryan Masters, MD

## 2024-03-02 NOTE — Progress Notes (Addendum)
ERROR, no show

## 2024-03-03 ENCOUNTER — Ambulatory Visit: Payer: Self-pay | Admitting: Family Medicine

## 2024-03-03 DIAGNOSIS — Z3493 Encounter for supervision of normal pregnancy, unspecified, third trimester: Secondary | ICD-10-CM

## 2024-03-03 DIAGNOSIS — O99013 Anemia complicating pregnancy, third trimester: Secondary | ICD-10-CM

## 2024-03-03 DIAGNOSIS — Z3A34 34 weeks gestation of pregnancy: Secondary | ICD-10-CM

## 2024-03-03 DIAGNOSIS — Z8759 Personal history of other complications of pregnancy, childbirth and the puerperium: Secondary | ICD-10-CM

## 2024-03-14 ENCOUNTER — Inpatient Hospital Stay (HOSPITAL_COMMUNITY)

## 2024-03-14 ENCOUNTER — Other Ambulatory Visit: Payer: Self-pay

## 2024-03-14 ENCOUNTER — Inpatient Hospital Stay (HOSPITAL_COMMUNITY)
Admission: AD | Admit: 2024-03-14 | Discharge: 2024-03-14 | Disposition: A | Discharge status: Home / Self Care | Attending: Obstetrics and Gynecology | Admitting: Obstetrics and Gynecology

## 2024-03-14 DIAGNOSIS — Z3A35 35 weeks gestation of pregnancy: Secondary | ICD-10-CM

## 2024-03-14 DIAGNOSIS — O368130 Decreased fetal movements, third trimester, not applicable or unspecified: Secondary | ICD-10-CM | POA: Diagnosis present

## 2024-03-14 DIAGNOSIS — W010XXA Fall on same level from slipping, tripping and stumbling without subsequent striking against object, initial encounter: Secondary | ICD-10-CM | POA: Diagnosis not present

## 2024-03-14 DIAGNOSIS — Z3689 Encounter for other specified antenatal screening: Secondary | ICD-10-CM

## 2024-03-14 DIAGNOSIS — O4703 False labor before 37 completed weeks of gestation, third trimester: Secondary | ICD-10-CM | POA: Diagnosis not present

## 2024-03-14 DIAGNOSIS — O26893 Other specified pregnancy related conditions, third trimester: Secondary | ICD-10-CM

## 2024-03-14 DIAGNOSIS — O99013 Anemia complicating pregnancy, third trimester: Secondary | ICD-10-CM | POA: Diagnosis not present

## 2024-03-14 DIAGNOSIS — W19XXXA Unspecified fall, initial encounter: Secondary | ICD-10-CM

## 2024-03-14 LAB — CBC
HCT: 28.4 % — ABNORMAL LOW (ref 36.0–46.0)
Hemoglobin: 9.5 g/dL — ABNORMAL LOW (ref 12.0–15.0)
MCH: 30.5 pg (ref 26.0–34.0)
MCHC: 33.5 g/dL (ref 30.0–36.0)
MCV: 91.3 fL (ref 80.0–100.0)
Platelets: 213 K/uL (ref 150–400)
RBC: 3.11 MIL/uL — ABNORMAL LOW (ref 3.87–5.11)
RDW: 12.8 % (ref 11.5–15.5)
WBC: 12 K/uL — ABNORMAL HIGH (ref 4.0–10.5)
nRBC: 0 % (ref 0.0–0.2)

## 2024-03-14 MED ORDER — LACTATED RINGERS IV BOLUS: 1000.0000 mL | Freq: Once | INTRAVENOUS | Status: DC

## 2024-03-14 NOTE — MAU Note (Signed)
 Jane Duke is a 24 y.o. at [redacted]w[redacted]d here in MAU reporting: fell 10-15 minutes ago because her shoe broke and tripped her. States she fell on her right side to not land on her belly. Right elbow and both knees are scraped and bleeding. Hit more on her hip but unsure if she hit her abdomen or not. Denies any LOF or VB. Reports not feeling fetal movement after the fall. Did not hit her head or lose consciousness.   Onset of complaint: 1035 Pain score: 6 Vitals:   03/14/24 1052  BP: 116/78  Pulse: 90  Resp: 18  Temp: 98.1 F (36.7 C)  SpO2: 99%     FHT:144 Lab orders placed from triage:  NST

## 2024-03-14 NOTE — MAU Provider Note (Cosign Needed Addendum)
 Chief Complaint:  Fall and Decreased Fetal Movement   HPI   None     Jane Duke is a 24 y.o. G2P1001 at [redacted]w[redacted]d who presents to maternity admissions reporting a fall. She reports her shoe broke and tripped her at around 1035, and she fell on her right side. She denies direct abdominal trauma. Denies vaginal bleeding, leaking of fluid, contractions, abdominal pain. She reports fetal movement has been normal since arrival to the MAU, prior to arrival she was not paying attention to fetal movement in the 15 minutes that elapsed between the fall and arrival to MAU. Her right elbow and bilateral knees are scraped.   Pregnancy Course: Receives care at [facility] . Prenatal records reviewed. Pregnancy complicated by anemia and history of gHTN.  Past Medical History:  Diagnosis Date   Medical history non-contributory    OB History  Gravida Para Term Preterm AB Living  2 1 1  0 0 1  SAB IAB Ectopic Multiple Live Births  0 0 0 0 1    # Outcome Date GA Lbr Len/2nd Weight Sex Type Anes PTL Lv  2 Current           1 Term 12/20/21 [redacted]w[redacted]d 12:47 / 00:17 3374 g F Vag-Spont None  LIV   Past Surgical History:  Procedure Laterality Date   NO PAST SURGERIES     Family History  Problem Relation Age of Onset   Hypertension Mother    Hypertension Maternal Grandmother    Kidney failure Maternal Grandmother    Diabetes Maternal Grandmother    Asthma Neg Hx    Cancer Neg Hx    Heart disease Neg Hx    Social History[1] Allergies[2] Medications Prior to Admission  Medication Sig Dispense Refill Last Dose/Taking   aspirin  81 MG chewable tablet Chew 1 tablet (81 mg total) by mouth daily. 90 tablet 2 03/13/2024   ferrous sulfate  325 (65 FE) MG EC tablet Take 1 tablet (325 mg total) by mouth every other day. 30 tablet 3 03/13/2024   Prenatal Vit-Fe Fumarate-FA (MULTIVITAMIN-PRENATAL) 27-0.8 MG TABS tablet Take 1 tablet by mouth daily at 12 noon.   03/13/2024    I  have reviewed patient's Past Medical Hx, Surgical Hx, Family Hx, Social Hx, medications and allergies.   ROS  Pertinent items noted in HPI and remainder of comprehensive ROS otherwise negative.   PHYSICAL EXAM  Patient Vitals for the past 24 hrs:  BP Temp Temp src Pulse Resp SpO2 Height Weight  03/14/24 1052 116/78 98.1 F (36.7 C) Oral 90 18 99 % 5' 5 (1.651 m) 75.7 kg    Constitutional: Well-developed, well-nourished female in no acute distress.  HEENT: atraumatic, normocephalic. Neck has normal ROM. EOM intact. Cardiovascular: normal rate & rhythm, warm and well-perfused Respiratory: normal effort, no problems with respiration noted GI: Abd soft, non-tender, non-distended MSK: Extremities nontender, no edema, normal ROM. Abrasions on knees bilaterally and right elbow. No bruising. Skin: warm and dry. Acyanotic, no jaundice or pallor. Neurologic: Alert and oriented x 4. No abnormal coordination. Psychiatric: Normal mood. Speech not slurred, not rapid/pressured. Patient is cooperative. Dilation: Fingertip Effacement (%): Thick Exam by:: Wallace, PA  Cervical exam chaperoned by Hunter Stanley RN  Fetal Tracing: Baseline FHR: 140 per minute Fetal heart variability: moderate Fetal Heart Rate accelerations: yes Fetal Heart Rate decelerations: none Fetal Non-stress Test: Category I (reactive) Toco: uterine irritability, rare uterine contractions   Labs: Results for orders placed or performed during  the hospital encounter of 03/14/24 (from the past 24 hours)  CBC     Status: Abnormal   Collection Time: 03/14/24 11:25 AM  Result Value Ref Range   WBC 12.0 (H) 4.0 - 10.5 K/uL   RBC 3.11 (L) 3.87 - 5.11 MIL/uL   Hemoglobin 9.5 (L) 12.0 - 15.0 g/dL   HCT 71.5 (L) 63.9 - 53.9 %   MCV 91.3 80.0 - 100.0 fL   MCH 30.5 26.0 - 34.0 pg   MCHC 33.5 30.0 - 36.0 g/dL   RDW 87.1 88.4 - 84.4 %   Platelets 213 150 - 400 K/uL   nRBC 0.0 0.0 - 0.2 %    Imaging:  No results found.  MDM  & MAU COURSE  MDM: Moderate  MAU Course: -Vital signs within normal limits. -No contractions or vaginal bleeding. Collecting CBC to rule out acute blood loss anemia. -Discussed minimum of 4 hours of monitoring, patient is agreeable. -Rare uterine contractions until about 10 minutes from 1320-1330 of uterine contractions on toco every 1-2 minutes, patient denies feeling these. After emptying bladder, uterine contractions stopped on toco.  Patient declined IV fluids, would prefer aggressive PO hydration. -In addition to lack of vaginal bleeding or abdominal pain, US  to confirm no placental abruption and BPP for fetal well-being. -BPP 8/8, no evidence of placental abruption on US . -No cervical dilation on exam. Discussed with Dr. Nicholaus, in the absence of direct abdominal trauma safe to discharge home with reassuring US , NST, and no abdominal pain or contractions.  Orders Placed This Encounter  Procedures   US  MFM FETAL BPP WO NON STRESS   US  MFM OB LIMITED   CBC   Amb Referral to Intravenous Iron Therapy   Discharge patient   Meds ordered this encounter  Medications   DISCONTD: lactated ringers  bolus 1,000 mL    ASSESSMENT   1. Fall, initial encounter   2. NST (non-stress test) reactive   3. [redacted] weeks gestation of pregnancy   4. Anemia in pregnancy, third trimester     PLAN  Discharge home in stable condition with preterm labor precautions.  Continue PO iron supplement daily. Referral sent for IV iron infusions.   Follow-up Information     [facility] Follow up.   Specialty: Obstetrics and Gynecology Why: As scheduled for ongoing prenatal care Contact information: [address] [place]  [address] [phone]                 Allergies as of 03/14/2024   No Known Allergies      Medication List     TAKE these medications    aspirin  81 MG chewable tablet Chew 1 tablet (81 mg total) by  mouth daily.   ferrous sulfate  325 (65 FE) MG EC tablet Take 1 tablet (325 mg total) by mouth every other day.   multivitamin-prenatal 27-0.8 MG Tabs tablet Take 1 tablet by mouth daily at 12 noon.        Joesph DELENA Sear, PA      [1]  Social History Tobacco Use   Smoking status: Former    Types: Cigars   Smokeless tobacco: Never  Vaping Use   Vaping status: Never Used  Substance Use Topics   Alcohol use: Not Currently    Comment: last alcohol intake 2024   Drug use: Not Currently    Types: Marijuana    Comment: last use sep 2025  [2] No Known Allergies

## 2024-03-14 NOTE — Discharge Instructions (Addendum)
 Continue your iron supplement daily. I am also going to have the Infusion Center Contact you to get a couple of iron infusions scheduled!  Reasons to return to MAU at [facility]: Less than 36 weeks: Contractions feels like menstrual cramps. You should go to the hospital if you have more than 6 contractions in an hour, even after you have rested and drank at least 16 ounces of water.  More than 36 weeks: You begin to have strong, frequent contractions 5 minutes apart or less, each last 1 minute, these have been going on for 1-2 hours, and you cannot walk or talk during them. Your water breaks.  Sometimes it is a big gush of fluid. However, many times it may it may be much more subtle. You should go to the hospital if you have a constant leakage of fluid from your vagina, enough to soak a pad when you are walking around.  You have vaginal bleeding.  It is normal to have a small amount of spotting if your cervix was checked. If you have bleeding requiring the use of a pad, go to the hospital. You don't feel your baby moving like normal.  If you think that you babys movement is decreased, eat a snack and rest on your left side in a quiet room for one hour. If you have not felt the baby move more than 6 times in an hour GO TO THE HOSPITAL.

## 2024-03-15 ENCOUNTER — Telehealth (HOSPITAL_COMMUNITY): Payer: Self-pay | Admitting: Family Medicine

## 2024-03-15 NOTE — Telephone Encounter (Signed)
 Patient referred to infusion pharmacy team for ambulatory infusion of IV iron.  Insurance - [place] Medicaid prepaid Site of care - Site of care: [facility] Dx code - O99.013 IV Iron Therapy - Feraheme 510 mg x 2 Infusion appointments - Scheduling team will schedule patient as soon as possible.    Thank you,  Norton Blush, PharmD, [facility] Pharmacist [facility]

## 2024-03-16 ENCOUNTER — Telehealth (HOSPITAL_COMMUNITY): Payer: Self-pay | Admitting: Pharmacy Technician

## 2024-03-16 NOTE — Telephone Encounter (Incomplete)
 PATIENT ASSISTANCE: PENDING Site of care: [facility] PAP PROGRAM: *** Medication & CPT/J Code(s) submitted: Feraheme (ferumoxytol) [id number] Diagnosis Code: O99.013  Auth type: FREE DRUG Units/visits requested: 510mg  x 2 doses Approval from: *** to ***    Dagoberto Armour, CPhT [facility] Phone: [phone] 03/16/2024

## 2024-03-17 ENCOUNTER — Other Ambulatory Visit (HOSPITAL_COMMUNITY)
Admission: RE | Admit: 2024-03-17 | Discharge: 2024-03-17 | Disposition: A | Discharge status: Home / Self Care | Payer: Self-pay | Source: Ambulatory Visit | Attending: Family Medicine | Admitting: Family Medicine

## 2024-03-17 ENCOUNTER — Ambulatory Visit (INDEPENDENT_AMBULATORY_CARE_PROVIDER_SITE_OTHER): Payer: Self-pay | Admitting: Family Medicine

## 2024-03-17 ENCOUNTER — Other Ambulatory Visit: Payer: Self-pay

## 2024-03-17 VITALS — BP 119/76 | HR 101 | Wt 166.6 lb

## 2024-03-17 DIAGNOSIS — Z8759 Personal history of other complications of pregnancy, childbirth and the puerperium: Secondary | ICD-10-CM | POA: Diagnosis not present

## 2024-03-17 DIAGNOSIS — O99013 Anemia complicating pregnancy, third trimester: Secondary | ICD-10-CM | POA: Diagnosis not present

## 2024-03-17 DIAGNOSIS — Z3493 Encounter for supervision of normal pregnancy, unspecified, third trimester: Secondary | ICD-10-CM

## 2024-03-17 DIAGNOSIS — Z23 Encounter for immunization: Secondary | ICD-10-CM | POA: Diagnosis not present

## 2024-03-17 DIAGNOSIS — Z3A36 36 weeks gestation of pregnancy: Secondary | ICD-10-CM | POA: Diagnosis present

## 2024-03-17 NOTE — Addendum Note (Signed)
 Addended by: ELDONNA SUZEN SAILOR on: 03/17/2024 08:58 AM   Modules accepted: Level of Service

## 2024-03-17 NOTE — Progress Notes (Signed)
 "    PRENATAL VISIT NOTE:Centering Pregnancy Group 23, Session 9   Subjective:  Jane Duke is a 24 y.o. G2P1001 at [redacted]w[redacted]d being seen today for ongoing prenatal care.  She is currently monitored for the following issues for this low-risk pregnancy and has Supervision of low-risk pregnancy; History of gestational hypertension; and Anemia in pregnancy, third trimester on their problem list.  Patient reports no complaints.  Contractions: Not present. Vag. Bleeding: None.  Movement: Present. Denies leaking of fluid.   The following portions of the patient's history were reviewed and updated as appropriate: allergies, current medications, past family history, past medical history, past social history, past surgical history and problem list.   Objective:   Vitals:   03/17/24 0907  BP: 119/76  Pulse: (!) 101  Weight: 166 lb 9.6 oz (75.6 kg)    Fetal Status:  Fetal Heart Rate (bpm): 145 Fundal Height: 36 cm Movement: Present Presentation: Vertex  General: Alert, oriented and cooperative. Patient is in no acute distress.  Skin: Skin is warm and dry. No rash noted.   Cardiovascular: Normal heart rate noted  Respiratory: Normal respiratory effort, no problems with respiration noted  Abdomen: Soft, gravid, appropriate for gestational age.  Pain/Pressure: Absent     Pelvic: Cervical exam deferred        Extremities: Normal range of motion.     Mental Status: Normal mood and affect. Normal behavior. Normal judgment and thought content.      01/07/2024   11:21 AM 11/05/2023    4:31 PM 12/17/2021    2:57 PM  Depression screen PHQ 2/9  Decreased Interest 0 0 0  Down, Depressed, Hopeless 0 0 0  PHQ - 2 Score 0 0 0  Altered sleeping 1 0 1  Tired, decreased energy 0 0 0  Change in appetite 0 0 1  Feeling bad or failure about yourself  0 0 0  Trouble concentrating 0 0 0  Moving slowly or fidgety/restless 0 0 0  Suicidal thoughts 0 0 0  PHQ-9 Score 1  0  2      Data saved with a previous  flowsheet row definition        01/07/2024   11:21 AM 11/05/2023    4:31 PM 12/17/2021    2:57 PM 10/04/2021   10:44 AM  GAD 7 : Generalized Anxiety Score  Nervous, Anxious, on Edge 0 0 1 1  Control/stop worrying 0 0 1 0  Worry too much - different things 0 0 0 0  Trouble relaxing 0 1 0 0  Restless 0 0 0 1  Easily annoyed or irritable 0 1 1 1   Afraid - awful might happen 0 0 0 0  Total GAD 7 Score 0 2 3 3     Assessment and Plan:  Pregnancy: G2P1001 at [redacted]w[redacted]d 1. Encounter for supervision of low-risk pregnancy in third trimester (Primary)  Centering Pregnancy, Session#9: Reviewed resources in [employer].   Facilitated discussion today:  decisions after baby-medications, circumcision. Reviewed pediatrician selection, peds care in hospital. Reviewed labor coping and labor decisions. Discussed family planning options and specifically BTS.   Fundal height and FHR appropriate today unless noted otherwise in plan. Patient to continue group care and then will have 1:1 care.   [place] no longer desires waterbirth, did not take the class.  Plans on unmedicated birth but not water immersion  - Culture, beta strep (group b only) - Cervicovaginal ancillary only( [place]) - Respiratory syncytial virus vaccine, preF, subunit,  bivalent,(Abrysvo)  2. History of gestational hypertension BP WNL  3. Anemia in pregnancy, third trimester Seen in [facility] and HGB was 9.5 She was referred for IV infusions, unsure if she will get them prior to delivery Continue oral Fe  4. [redacted] weeks gestation of pregnancy Self Collected - Culture, beta strep (group b only) - Cervicovaginal ancillary only( [place])  Preterm labor symptoms and general obstetric precautions including but not limited to vaginal bleeding, contractions, leaking of fluid and fetal movement were reviewed in detail with the patient. Please refer to After Visit Summary for other counseling recommendations.   Return in about 2  weeks (around 03/31/2024) for Centering Pregnancy.  Future Appointments  Date Time Provider Department Center  03/31/2024  9:00 AM CENTERING PROVIDER [facility]    Suzen Maryan Masters, MD "

## 2024-03-18 LAB — CERVICOVAGINAL ANCILLARY ONLY
Chlamydia: NEGATIVE
Comment: NEGATIVE
Comment: NORMAL
Neisseria Gonorrhea: NEGATIVE

## 2024-03-21 LAB — CULTURE, BETA STREP (GROUP B ONLY): Strep Gp B Culture: NEGATIVE

## 2024-03-23 ENCOUNTER — Ambulatory Visit: Payer: Self-pay | Admitting: Family Medicine

## 2024-03-30 ENCOUNTER — Encounter (HOSPITAL_COMMUNITY): Payer: Self-pay | Admitting: Family Medicine

## 2024-03-31 ENCOUNTER — Other Ambulatory Visit: Payer: Self-pay

## 2024-03-31 ENCOUNTER — Ambulatory Visit: Payer: Self-pay | Admitting: Family Medicine

## 2024-03-31 VITALS — BP 129/76 | HR 84 | Wt 172.6 lb

## 2024-03-31 DIAGNOSIS — Z3A38 38 weeks gestation of pregnancy: Secondary | ICD-10-CM

## 2024-03-31 DIAGNOSIS — Z8759 Personal history of other complications of pregnancy, childbirth and the puerperium: Secondary | ICD-10-CM

## 2024-03-31 DIAGNOSIS — O99013 Anemia complicating pregnancy, third trimester: Secondary | ICD-10-CM

## 2024-03-31 DIAGNOSIS — Z3493 Encounter for supervision of normal pregnancy, unspecified, third trimester: Secondary | ICD-10-CM

## 2024-03-31 LAB — CBC
Hematocrit: 27.4 % — ABNORMAL LOW (ref 34.0–46.6)
Hemoglobin: 9.3 g/dL — ABNORMAL LOW (ref 11.1–15.9)
MCH: 30.3 pg (ref 26.6–33.0)
MCHC: 33.9 g/dL (ref 31.5–35.7)
MCV: 89 fL (ref 79–97)
Platelets: 244 10*3/uL (ref 150–450)
RBC: 3.07 x10E6/uL — ABNORMAL LOW (ref 3.77–5.28)
RDW: 11.9 % (ref 11.7–15.4)
WBC: 11.3 10*3/uL — ABNORMAL HIGH (ref 3.4–10.8)

## 2024-03-31 NOTE — Progress Notes (Signed)
 "    PRENATAL VISIT NOTE: Centering Pregnancy Group 23, Session 10   Subjective:  Jane Duke is a 24 y.o. G2P1001 at [redacted]w[redacted]d being seen today for ongoing prenatal care.  She is currently monitored for the following issues for this low-risk pregnancy and has Supervision of low-risk pregnancy; History of gestational hypertension; and Anemia in pregnancy, third trimester on their problem list.  Patient reports no complaints.  Contractions: Not present. Vag. Bleeding: None.  Movement: Present. Denies leaking of fluid.   The following portions of the patient's history were reviewed and updated as appropriate: allergies, current medications, past family history, past medical history, past social history, past surgical history and problem list.   Objective:   Vitals:   03/31/24 0911  BP: 129/76  Pulse: 84  Weight: 172 lb 9.6 oz (78.3 kg)    Fetal Status:  Fetal Heart Rate (bpm): 135 Fundal Height: 38 cm Movement: Present Presentation: Vertex  General: Alert, oriented and cooperative. Patient is in no acute distress.  Skin: Skin is warm and dry. No rash noted.   Cardiovascular: Normal heart rate noted  Respiratory: Normal respiratory effort, no problems with respiration noted  Abdomen: Soft, gravid, appropriate for gestational age.  Pain/Pressure: Absent     Pelvic: Cervical exam deferred        Extremities: Normal range of motion.     Mental Status: Normal mood and affect. Normal behavior. Normal judgment and thought content.      01/07/2024   11:21 AM 11/05/2023    4:31 PM 12/17/2021    2:57 PM  Depression screen PHQ 2/9  Decreased Interest 0 0 0  Down, Depressed, Hopeless 0 0 0  PHQ - 2 Score 0 0 0  Altered sleeping 1 0 1  Tired, decreased energy 0 0 0  Change in appetite 0 0 1  Feeling bad or failure about yourself  0 0 0  Trouble concentrating 0 0 0  Moving slowly or fidgety/restless 0 0 0  Suicidal thoughts 0 0 0  PHQ-9 Score 1  0  2      Data saved with a previous  flowsheet row definition        01/07/2024   11:21 AM 11/05/2023    4:31 PM 12/17/2021    2:57 PM 10/04/2021   10:44 AM  GAD 7 : Generalized Anxiety Score  Nervous, Anxious, on Edge 0  0  1  1   Control/stop worrying 0  0  1  0   Worry too much - different things 0  0  0  0   Trouble relaxing 0  1  0  0   Restless 0  0  0  1   Easily annoyed or irritable 0  1  1  1    Afraid - awful might happen 0  0  0  0   Total GAD 7 Score 0 2 3 3      Data saved with a previous flowsheet row definition    Assessment and Plan:  Pregnancy: G2P1001 at 105w0d 1. Encounter for supervision of low-risk pregnancy in third trimester (Primary)  Centering Pregnancy, Session#10: Reviewed resources in [employer].   Facilitated discussion today:  preparing for labor, expectations for one:one visits  Fundal height and FHR appropriate today unless noted otherwise in plan. Patient to continue group care.    2. History of gestational hypertension On ASA  3. Anemia in pregnancy, third trimester Referral had been placed for Fe infusions from [facility],  patient had not heard from them yet On Fe supplement, has been on oral Fe since 28 weeks Lab Results  Component Value Date   HGB 9.5 (L) 03/14/2024   HGB 10.5 (L) 01/07/2024   HGB 10.9 (L) 11/05/2023    4. [redacted] weeks gestation of pregnancy   Term labor symptoms and general obstetric precautions including but not limited to vaginal bleeding, contractions, leaking of fluid and fetal movement were reviewed in detail with the patient. Please refer to After Visit Summary for other counseling recommendations.   Return in about 1 week (around 04/07/2024) for Routine prenatal care with Dr Eldonna.  Future Appointments  Date Time Provider Department Center  04/07/2024  3:15 PM Eldonna Suzen Octave, MD [facility]  04/14/2024  8:55 AM Eldonna Suzen Octave, MD [facility]    Suzen Octave Eldonna, MD "

## 2024-04-02 ENCOUNTER — Ambulatory Visit: Payer: Self-pay | Admitting: Family Medicine

## 2024-04-07 ENCOUNTER — Other Ambulatory Visit: Payer: Self-pay

## 2024-04-07 ENCOUNTER — Ambulatory Visit: Payer: Self-pay | Admitting: Family Medicine

## 2024-04-07 VITALS — BP 131/77 | HR 84 | Wt 175.3 lb

## 2024-04-07 DIAGNOSIS — Z3A39 39 weeks gestation of pregnancy: Secondary | ICD-10-CM

## 2024-04-07 DIAGNOSIS — O99013 Anemia complicating pregnancy, third trimester: Secondary | ICD-10-CM

## 2024-04-07 DIAGNOSIS — Z8759 Personal history of other complications of pregnancy, childbirth and the puerperium: Secondary | ICD-10-CM

## 2024-04-07 DIAGNOSIS — Z3493 Encounter for supervision of normal pregnancy, unspecified, third trimester: Secondary | ICD-10-CM

## 2024-04-07 NOTE — Progress Notes (Signed)
 "  PRENATAL VISIT NOTE  Subjective:  Jane Duke is a 24 y.o. G2P1001 at [redacted]w[redacted]d being seen today for ongoing prenatal care.  She is currently monitored for the following issues for this low-risk pregnancy and has Supervision of low-risk pregnancy; History of gestational hypertension; and Anemia in pregnancy, third trimester on their problem list.  Patient reports no complaints.  Contractions: Not present. Vag. Bleeding: None.  Movement: Present. Denies leaking of fluid.   The following portions of the patient's history were reviewed and updated as appropriate: allergies, current medications, past family history, past medical history, past social history, past surgical history and problem list.   Objective:   Vitals:   04/07/24 1527  BP: 131/77  Pulse: 84  Weight: 175 lb 4.8 oz (79.5 kg)    Fetal Status:  Fetal Heart Rate (bpm): 138 Fundal Height: 38 cm Movement: Present Presentation: Vertex  General: Alert, oriented and cooperative. Patient is in no acute distress.  Skin: Skin is warm and dry. No rash noted.   Cardiovascular: Normal heart rate noted  Respiratory: Normal respiratory effort, no problems with respiration noted  Abdomen: Soft, gravid, appropriate for gestational age.  Pain/Pressure: Absent     Pelvic: Cervical exam deferred Dilation: 1 Effacement (%): 50 Station: -3  Extremities: Normal range of motion.  Edema: None  Mental Status: Normal mood and affect. Normal behavior. Normal judgment and thought content.      01/07/2024   11:21 AM 11/05/2023    4:31 PM 12/17/2021    2:57 PM  Depression screen PHQ 2/9  Decreased Interest 0 0 0  Down, Depressed, Hopeless 0 0 0  PHQ - 2 Score 0 0 0  Altered sleeping 1 0 1  Tired, decreased energy 0 0 0  Change in appetite 0 0 1  Feeling bad or failure about yourself  0 0 0  Trouble concentrating 0 0 0  Moving slowly or fidgety/restless 0 0 0  Suicidal thoughts 0 0 0  PHQ-9 Score 1  0  2      Data saved with a previous  flowsheet row definition        01/07/2024   11:21 AM 11/05/2023    4:31 PM 12/17/2021    2:57 PM 10/04/2021   10:44 AM  GAD 7 : Generalized Anxiety Score  Nervous, Anxious, on Edge 0  0  1  1   Control/stop worrying 0  0  1  0   Worry too much - different things 0  0  0  0   Trouble relaxing 0  1  0  0   Restless 0  0  0  1   Easily annoyed or irritable 0  1  1  1    Afraid - awful might happen 0  0  0  0   Total GAD 7 Score 0 2 3 3      Data saved with a previous flowsheet row definition    Assessment and Plan:  Pregnancy: G2P1001 at [redacted]w[redacted]d 1. [redacted] weeks gestation of pregnancy (Primary)  2. Encounter for supervision of low-risk pregnancy in third trimester Up to date FH appropriate Vigorous movement Concerns: none IOL scheduled for [redacted]w[redacted]d by patient request. She would prefer a Friday for childcare coverage. Will need BPP on 2/18 given going past 41wk Reviewed fetal movement expectations  3. History of gestational hypertension On ASA  4. Anemia in pregnancy, third trimester Continues to be anemia Received chat from pharmacy tech about needing financial assistance form  Preterm labor symptoms and general obstetric precautions including but not limited to vaginal bleeding, contractions, leaking of fluid and fetal movement were reviewed in detail with the patient. Please refer to After Visit Summary for other counseling recommendations.   Return in about 2 weeks (around 04/21/2024) for Routine prenatal care.  Future Appointments  Date Time Provider Department Center  04/14/2024  8:55 AM Eldonna Suzen Octave, MD [facility]  04/21/2024  1:55 PM Eldonna Suzen Octave, MD [facility]  04/21/2024  2:40 PM [facility]    Suzen Octave Eldonna, MD "

## 2024-04-08 ENCOUNTER — Encounter: Payer: Self-pay | Admitting: Obstetrics & Gynecology

## 2024-04-14 ENCOUNTER — Encounter: Payer: Self-pay | Admitting: Family Medicine

## 2024-04-21 ENCOUNTER — Encounter: Payer: Self-pay | Admitting: Family Medicine

## 2024-04-21 ENCOUNTER — Other Ambulatory Visit: Payer: Self-pay

## 2024-04-23 ENCOUNTER — Inpatient Hospital Stay (HOSPITAL_COMMUNITY): Payer: Self-pay

## 2024-04-23 ENCOUNTER — Inpatient Hospital Stay (HOSPITAL_COMMUNITY): Admission: AD | Admit: 2024-04-23 | Payer: Self-pay | Source: Home / Self Care
